# Patient Record
Sex: Female | Born: 1985 | Race: Black or African American | Hispanic: No | Marital: Single | Smoking: Former smoker
Health system: Southern US, Community
[De-identification: ages and names within clinical notes are randomized; demographics above are authoritative.]

## PROBLEM LIST (undated history)

## (undated) DIAGNOSIS — E079 Disorder of thyroid, unspecified: Secondary | ICD-10-CM

## (undated) DIAGNOSIS — I10 Essential (primary) hypertension: Secondary | ICD-10-CM

## (undated) DIAGNOSIS — N200 Calculus of kidney: Secondary | ICD-10-CM

## (undated) DIAGNOSIS — N83201 Unspecified ovarian cyst, right side: Secondary | ICD-10-CM

## (undated) DIAGNOSIS — Z87442 Personal history of urinary calculi: Secondary | ICD-10-CM

---
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
## 2011-03-16 ENCOUNTER — Emergency Department (HOSPITAL_COMMUNITY)
Admission: EM | Admit: 2011-03-16 | Discharge: 2011-03-16 | Disposition: A | Discharge status: Home / Self Care | Payer: Self-pay | Attending: Emergency Medicine | Admitting: Emergency Medicine

## 2011-03-16 ENCOUNTER — Emergency Department (HOSPITAL_COMMUNITY): Payer: Self-pay

## 2011-03-16 DIAGNOSIS — R51 Headache: Secondary | ICD-10-CM | POA: Insufficient documentation

## 2011-03-16 DIAGNOSIS — R079 Chest pain, unspecified: Secondary | ICD-10-CM | POA: Insufficient documentation

## 2011-03-16 DIAGNOSIS — I10 Essential (primary) hypertension: Secondary | ICD-10-CM | POA: Insufficient documentation

## 2011-03-16 LAB — CK TOTAL AND CKMB (NOT AT ARMC)
CK, MB: 1.4 ng/mL (ref 0.3–4.0)
Total CK: 114 U/L (ref 7–177)

## 2011-03-16 LAB — BASIC METABOLIC PANEL
BUN: 5 mg/dL — ABNORMAL LOW (ref 6–23)
CO2: 22 mEq/L (ref 19–32)
Calcium: 12 mg/dL — ABNORMAL HIGH (ref 8.4–10.5)
Creatinine, Ser: 0.62 mg/dL (ref 0.50–1.10)
GFR calc non Af Amer: >60 mL/min (ref >60)
Glucose, Bld: 107 mg/dL — ABNORMAL HIGH (ref 70–99)
Sodium: 140 mEq/L (ref 135–145)

## 2012-05-24 ENCOUNTER — Emergency Department (HOSPITAL_COMMUNITY)
Admission: EM | Admit: 2012-05-24 | Discharge: 2012-05-24 | Disposition: A | Discharge status: Home / Self Care | Payer: Self-pay | Attending: Emergency Medicine | Admitting: Emergency Medicine

## 2012-05-24 ENCOUNTER — Encounter (HOSPITAL_COMMUNITY): Payer: Self-pay | Admitting: *Deleted

## 2012-05-24 DIAGNOSIS — K0889 Other specified disorders of teeth and supporting structures: Secondary | ICD-10-CM

## 2012-05-24 DIAGNOSIS — F17200 Nicotine dependence, unspecified, uncomplicated: Secondary | ICD-10-CM | POA: Insufficient documentation

## 2012-05-24 DIAGNOSIS — K089 Disorder of teeth and supporting structures, unspecified: Secondary | ICD-10-CM | POA: Insufficient documentation

## 2012-05-24 MED ORDER — PENICILLIN V POTASSIUM 250 MG PO TABS: 250.0000 mg | ORAL_TABLET | Freq: Four times a day (QID) | ORAL | Status: AC

## 2012-05-24 MED ORDER — OXYCODONE-ACETAMINOPHEN 5-325 MG PO TABS
1.0000 | ORAL_TABLET | Freq: Once | ORAL | Status: AC
  Administered 2012-05-24: 1 via ORAL
  Filled 2012-05-24: qty 1

## 2012-05-24 MED ORDER — TRAMADOL HCL 50 MG PO TABS: 50.0000 mg | ORAL_TABLET | Freq: Four times a day (QID) | ORAL | Status: AC | PRN

## 2012-05-24 NOTE — ED Notes (Signed)
Pt reports taking 800 mg motrin with moderate relief for the past month.  States she thinks she's immune to them now because they no longer work.

## 2012-05-24 NOTE — ED Notes (Signed)
Pt is here with right lower jaw pain and swelling from tooth

## 2012-05-25 NOTE — ED Provider Notes (Signed)
History     CSN: [id number]  Arrival date & time 05/24/12  3086   First MD Initiated Contact with Patient 05/24/12 [phone]      Chief Complaint  Patient presents with  . Oral Swelling    right    (Consider location/radiation/quality/duration/timing/severity/associated sxs/prior treatment) Patient is a 26 y.o. female presenting with tooth pain. The history is provided by the patient.  Dental PainThe primary symptoms include mouth pain. Primary symptoms do not include dental injury, headaches, fever, sore throat or cough. The symptoms began 2 days ago. The symptoms are worsening. The symptoms are new. The symptoms occur constantly.  Additional symptoms include: dental sensitivity to temperature, gum tenderness, jaw pain and facial swelling. Additional symptoms do not include: purulent gums, trismus, trouble swallowing, pain with swallowing, excessive salivation, taste disturbance, smell disturbance and drooling. Medical issues include: periodontal disease.    History reviewed. No pertinent past medical history.  History reviewed. No pertinent past surgical history.  No family history on file.  History  Substance Use Topics  . Smoking status: Current Everyday Smoker  . Smokeless tobacco: Not on file  . Alcohol Use: Yes     occ    OB History    Grav Para Term Preterm Abortions TAB SAB Ect Mult Living                  Review of Systems  Constitutional: Negative for fever and chills.  HENT: Positive for facial swelling and dental problem. Negative for congestion, sore throat, rhinorrhea, drooling, mouth sores, trouble swallowing and neck pain.   Respiratory: Negative for cough.   Gastrointestinal: Negative for nausea.  Neurological: Negative for headaches.    Allergies  Review of patient's allergies indicates no known allergies.  Home Medications   Current Outpatient Rx  Name Route Sig Dispense Refill  . IBUPROFEN 200 MG PO TABS Oral Take 800 mg by mouth every 6 (six)  hours as needed. For pain    . PENICILLIN V POTASSIUM 250 MG PO TABS Oral Take 1 tablet (250 mg total) by mouth 4 (four) times daily. 40 tablet 0  . TRAMADOL HCL 50 MG PO TABS Oral Take 1 tablet (50 mg total) by mouth every 6 (six) hours as needed for pain. 15 tablet 0    BP 149/105  Pulse 103  Temp 98.5 F (36.9 C)  Resp 18  SpO2 94%  Physical Exam  Nursing note and vitals reviewed. Constitutional: She appears well-developed and well-nourished. No distress.  HENT:  Head: Normocephalic and atraumatic.  Mouth/Throat: Oropharynx is clear and moist. No oropharyngeal exudate.         Subtle R swelling along jawline. TTP to same. TTP to molar as diagrammed. No obvious evidence of gum swelling or purulence. Nontender to floor of mouth; no trismus.  Neck: Normal range of motion. Neck supple.  Cardiovascular: Normal rate.   Pulmonary/Chest: Effort normal.  Musculoskeletal: Normal range of motion.  Lymphadenopathy:    She has no cervical adenopathy.  Neurological: She is alert.  Skin: Skin is warm and dry. She is not diaphoretic.  Psychiatric: She has a normal mood and affect.    ED Course  Procedures (including critical care time)  Labs Reviewed - No data to display No results found.   1. Pain, dental       MDM  Pt presents with dental pain. No obvious evidence dental abscess but ttp to R lower molar as diagrammed, subtle facial swelling. Afebrile. Rxes for pcn, ultram.  Referral to dentist on call given as well as dental resources. Return precautions discussed.       Grant Fontana, PA-C 05/25/12 1315

## 2012-05-25 NOTE — ED Provider Notes (Signed)
Medical screening examination/treatment/procedure(s) were performed by non-physician practitioner and as supervising physician I was immediately available for consultation/collaboration.  Fadi Menter R. Heraclio Seidman, MD 05/25/12 1601 

## 2013-01-14 ENCOUNTER — Emergency Department (HOSPITAL_COMMUNITY)
Admission: EM | Admit: 2013-01-14 | Discharge: 2013-01-14 | Disposition: A | Discharge status: Home / Self Care | Payer: Self-pay | Attending: Emergency Medicine | Admitting: Emergency Medicine

## 2013-01-14 ENCOUNTER — Encounter (HOSPITAL_COMMUNITY): Payer: Self-pay | Admitting: *Deleted

## 2013-01-14 DIAGNOSIS — L255 Unspecified contact dermatitis due to plants, except food: Secondary | ICD-10-CM | POA: Insufficient documentation

## 2013-01-14 DIAGNOSIS — L237 Allergic contact dermatitis due to plants, except food: Secondary | ICD-10-CM

## 2013-01-14 DIAGNOSIS — F17200 Nicotine dependence, unspecified, uncomplicated: Secondary | ICD-10-CM | POA: Insufficient documentation

## 2013-01-14 MED ORDER — PREDNISONE 20 MG PO TABS: ORAL_TABLET | ORAL | Status: DC

## 2013-01-14 MED ORDER — PREDNISONE 20 MG PO TABS
60.0000 mg | ORAL_TABLET | Freq: Once | ORAL | Status: AC
  Administered 2013-01-14: 60 mg via ORAL
  Filled 2013-01-14: qty 3

## 2013-01-14 MED ORDER — DIPHENHYDRAMINE HCL 50 MG/ML IJ SOLN
50.0000 mg | Freq: Once | INTRAMUSCULAR | Status: AC
  Administered 2013-01-14: 50 mg via INTRAMUSCULAR
  Filled 2013-01-14: qty 1

## 2013-01-14 NOTE — ED Provider Notes (Signed)
History     CSN: [id number]  Arrival date & time 01/14/13  2841   First MD Initiated Contact with Patient 01/14/13 [phone]      Chief Complaint  Patient presents with  . Rash    (Consider location/radiation/quality/duration/timing/severity/associated sxs/prior treatment) The history is provided by the patient.  Tonya Brennan is a 27 y.o. female here presenting with rash. She went to go fishing and will touching some plants 3 days ago. She subsequently developed rash in her arms now spreading to her entire body and her face. The rash is very itchy but she has not been taking anything for it. No fevers or chills and no new medicines or over-the-counter products.    History reviewed. No pertinent past medical history.  History reviewed. No pertinent past surgical history.  No family history on file.  History  Substance Use Topics  . Smoking status: Current Every Day Smoker  . Smokeless tobacco: Not on file  . Alcohol Use: Yes     Comment: occ    OB History   Grav Para Term Preterm Abortions TAB SAB Ect Mult Living                  Review of Systems  Skin: Positive for rash.  All other systems reviewed and are negative.    Allergies  Review of patient's allergies indicates no known allergies.  Home Medications   Current Outpatient Rx  Name  Route  Sig  Dispense  Refill  . ibuprofen (ADVIL,MOTRIN) 200 MG tablet   Oral   Take 800 mg by mouth every 6 (six) hours as needed. For pain           BP 138/98  Pulse 98  Temp(Src) 98.6 F (37 C) (Oral)  Ht 5\' 6"  (1.676 m)  Wt 170 lb (77.111 kg)  BMI 27.45 kg/m2  SpO2 100%  Physical Exam  Nursing note and vitals reviewed. Constitutional: She is oriented to person, place, and time. She appears well-developed and well-nourished.  Uncomfortable, itchy   HENT:  Head: Normocephalic.  Mouth/Throat: Oropharynx is clear and moist.  Eyes: Conjunctivae are normal. Pupils are equal, round, and reactive to light.  Neck:  Normal range of motion. Neck supple.  Cardiovascular: Normal rate, regular rhythm and normal heart sounds.   Pulmonary/Chest: Effort normal and breath sounds normal. No respiratory distress. She has no wheezes. She has no rales.  Abdominal: Soft. Bowel sounds are normal. She exhibits no distension. There is no tenderness. There is no rebound.  Musculoskeletal: Normal range of motion.  Neurological: She is alert and oriented to person, place, and time.  Skin: Skin is warm.  Diffuse hives mostly on bilateral arms but also involving cheek and abdomen. No super imposed infection.   Psychiatric: She has a normal mood and affect. Her behavior is normal. Judgment and thought content normal.    ED Course  Procedures (including critical care time)  Labs Reviewed - No data to display No results found.   No diagnosis found.    MDM  Tonya Brennan is a 27 y.o. female here with hives. The rash seemed like poison Ivy. No super imposed infection. Will give benadryl and steroids. I told her not to scratch as much as it will spread.           Richardean Canal, MD 01/14/13 [phone]

## 2013-01-14 NOTE — ED Notes (Signed)
C/o worsening & spreading rash since Tuesday night after going fishing

## 2013-12-24 ENCOUNTER — Emergency Department (HOSPITAL_COMMUNITY)
Admission: EM | Admit: 2013-12-24 | Discharge: 2013-12-24 | Disposition: A | Discharge status: Home / Self Care | Payer: Self-pay | Attending: Emergency Medicine | Admitting: Emergency Medicine

## 2013-12-24 ENCOUNTER — Encounter (HOSPITAL_COMMUNITY): Payer: Self-pay | Admitting: Emergency Medicine

## 2013-12-24 DIAGNOSIS — R1110 Vomiting, unspecified: Secondary | ICD-10-CM

## 2013-12-24 DIAGNOSIS — R000 Tachycardia, unspecified: Secondary | ICD-10-CM | POA: Insufficient documentation

## 2013-12-24 DIAGNOSIS — Z87891 Personal history of nicotine dependence: Secondary | ICD-10-CM | POA: Insufficient documentation

## 2013-12-24 DIAGNOSIS — R112 Nausea with vomiting, unspecified: Secondary | ICD-10-CM | POA: Insufficient documentation

## 2013-12-24 DIAGNOSIS — Z3202 Encounter for pregnancy test, result negative: Secondary | ICD-10-CM | POA: Insufficient documentation

## 2013-12-24 DIAGNOSIS — J029 Acute pharyngitis, unspecified: Secondary | ICD-10-CM | POA: Insufficient documentation

## 2013-12-24 DIAGNOSIS — N390 Urinary tract infection, site not specified: Secondary | ICD-10-CM | POA: Insufficient documentation

## 2013-12-24 LAB — URINALYSIS, ROUTINE W REFLEX MICROSCOPIC
BILIRUBIN URINE: NEGATIVE
Glucose, UA: NEGATIVE mg/dL
KETONES UR: NEGATIVE mg/dL
NITRITE: NEGATIVE
PH: 8 (ref 5.0–8.0)
Protein, ur: NEGATIVE mg/dL
Specific Gravity, Urine: 1.017 (ref 1.005–1.030)
Urobilinogen, UA: 0.2 mg/dL (ref 0.0–1.0)

## 2013-12-24 LAB — COMPREHENSIVE METABOLIC PANEL
ALBUMIN: 3.6 g/dL (ref 3.5–5.2)
ALK PHOS: 248 U/L — AB (ref 39–117)
ALT: 10 U/L (ref 0–35)
AST: 15 U/L (ref 0–37)
BILIRUBIN TOTAL: 0.4 mg/dL (ref 0.3–1.2)
BUN: 4 mg/dL — AB (ref 6–23)
CHLORIDE: 105 meq/L (ref 96–112)
CO2: 19 mEq/L (ref 19–32)
Calcium: 12.4 mg/dL — ABNORMAL HIGH (ref 8.4–10.5)
Creatinine, Ser: 0.67 mg/dL (ref 0.50–1.10)
GFR calc Af Amer: >90 mL/min (ref >90)
GFR calc non Af Amer: >90 mL/min (ref >90)
GLUCOSE: 121 mg/dL — AB (ref 70–99)
POTASSIUM: 4 meq/L (ref 3.7–5.3)
Sodium: 139 mEq/L (ref 137–147)
Total Protein: 7.3 g/dL (ref 6.0–8.3)

## 2013-12-24 LAB — URINE MICROSCOPIC-ADD ON

## 2013-12-24 LAB — CBC WITH DIFFERENTIAL/PLATELET
BASOS PCT: 0 % (ref 0–1)
Basophils Absolute: 0 10*3/uL (ref 0.0–0.1)
Eosinophils Absolute: 0 10*3/uL (ref 0.0–0.7)
Eosinophils Relative: 0 % (ref 0–5)
HCT: 43.9 % (ref 36.0–46.0)
HEMOGLOBIN: 14.8 g/dL (ref 12.0–15.0)
LYMPHS ABS: 2.8 10*3/uL (ref 0.7–4.0)
Lymphocytes Relative: 20 % (ref 12–46)
MCH: 30.7 pg (ref 26.0–34.0)
MCHC: 33.7 g/dL (ref 30.0–36.0)
MCV: 91.1 fL (ref 78.0–100.0)
MONOS PCT: 6 % (ref 3–12)
Monocytes Absolute: 0.8 10*3/uL (ref 0.1–1.0)
NEUTROS ABS: 10.5 10*3/uL — AB (ref 1.7–7.7)
NEUTROS PCT: 74 % (ref 43–77)
Platelets: 286 10*3/uL (ref 150–400)
RBC: 4.82 MIL/uL (ref 3.87–5.11)
RDW: 13.1 % (ref 11.5–15.5)
WBC: 14.2 10*3/uL — AB (ref 4.0–10.5)

## 2013-12-24 LAB — LIPASE, BLOOD: LIPASE: 14 U/L (ref 11–59)

## 2013-12-24 LAB — PREGNANCY, URINE: Preg Test, Ur: NEGATIVE

## 2013-12-24 LAB — RAPID STREP SCREEN (MED CTR MEBANE ONLY): STREPTOCOCCUS, GROUP A SCREEN (DIRECT): NEGATIVE

## 2013-12-24 MED ORDER — CEPHALEXIN 500 MG PO CAPS: 500.0000 mg | ORAL_CAPSULE | Freq: Four times a day (QID) | ORAL | Status: DC

## 2013-12-24 MED ORDER — ONDANSETRON HCL 4 MG/2ML IJ SOLN
4.0000 mg | Freq: Once | INTRAMUSCULAR | Status: AC
  Administered 2013-12-24: 4 mg via INTRAVENOUS
  Filled 2013-12-24: qty 2

## 2013-12-24 MED ORDER — ACETAMINOPHEN 500 MG PO TABS
1000.0000 mg | ORAL_TABLET | Freq: Once | ORAL | Status: AC
  Administered 2013-12-24: 1000 mg via ORAL
  Filled 2013-12-24: qty 2

## 2013-12-24 MED ORDER — SODIUM CHLORIDE 0.9 % IV BOLUS (SEPSIS)
1000.0000 mL | Freq: Once | INTRAVENOUS | Status: AC
  Administered 2013-12-24: 1000 mL via INTRAVENOUS

## 2013-12-24 NOTE — ED Provider Notes (Signed)
0730 - Care from Dr. Curly Rim. 69F here with 5 days of sore throat, myalgias, occasional body aches. Vitals stable. Labs show UTI, will treat with Keflex for 5 days. Patient feeling better, eating graham crackers, texting. Stable for discharge.  1. Pharyngitis   2. Vomiting   3. UTI (urinary tract infection)      Osvaldo Shipper, MD 12/24/13 [phone]

## 2013-12-24 NOTE — ED Provider Notes (Addendum)
CSN: [id number]     Arrival date & time 12/24/13  0617 History   First MD Initiated Contact with Patient 12/24/13 [phone]     Chief Complaint  Patient presents with  . Sore Throat  . Generalized Body Aches     (Consider location/radiation/quality/duration/timing/severity/associated sxs/prior Treatment) HPI Comments: 28 yo AA female with cc of 5 days of ST and 2 days of n/v.  Pt denies f/c.  She denies sick contact, recent travel, exposure to spoiled foods.    Symptoms began with ST then progressed to n/v over past 2 days.    LMP: 2 weeks ago   Patient is a 28 y.o. female presenting with pharyngitis and vomiting. The history is provided by the patient.  Sore Throat This is a new problem. The current episode started more than 2 days ago. The problem has not changed since onset.Pertinent negatives include no chest pain, no abdominal pain, no headaches and no shortness of breath. Nothing aggravates the symptoms. Nothing relieves the symptoms. She has tried nothing for the symptoms.  Emesis Severity:  Moderate Duration:  2 days Timing:  Intermittent Number of daily episodes:  5 Quality:  Stomach contents Able to tolerate:  Liquids Progression:  Unchanged Chronicity:  New Recent urination:  Normal Context: not post-tussive and not self-induced   Relieved by:  None tried Worsened by:  Nothing tried Ineffective treatments:  None tried Associated symptoms: myalgias, sore throat and URI   Associated symptoms: no abdominal pain, no arthralgias, no chills, no cough, no diarrhea, no fever and no headaches   Risk factors: no alcohol use, no diabetes, not pregnant now, no prior abdominal surgery, no sick contacts, no suspect food intake and no travel to endemic areas     History reviewed. No pertinent past medical history. History reviewed. No pertinent past surgical history. No family history on file. History  Substance Use Topics  . Smoking status: Former Research scientist (life sciences)  . Smokeless tobacco: Not  on file  . Alcohol Use: Yes     Comment: occ   OB History   Grav Para Term Preterm Abortions TAB SAB Ect Mult Living                 Review of Systems  Constitutional: Negative for fever, chills, diaphoresis, activity change, appetite change, fatigue and unexpected weight change.  HENT: Positive for sore throat. Negative for ear pain, facial swelling, mouth sores, nosebleeds, postnasal drip, rhinorrhea, sinus pressure, sneezing, tinnitus, trouble swallowing and voice change.   Eyes: Negative.   Respiratory: Negative.  Negative for apnea, cough, choking, chest tightness, shortness of breath, wheezing and stridor.   Cardiovascular: Negative.  Negative for chest pain, palpitations and leg swelling.  Gastrointestinal: Positive for nausea and vomiting. Negative for abdominal pain, diarrhea, constipation, blood in stool, abdominal distention, anal bleeding and rectal pain.  Endocrine: Negative.   Genitourinary: Negative.  Negative for dysuria, urgency, frequency, hematuria, flank pain, decreased urine volume, vaginal bleeding, vaginal discharge, genital sores, vaginal pain, menstrual problem and pelvic pain.  Musculoskeletal: Positive for myalgias. Negative for arthralgias, back pain, gait problem, joint swelling, neck pain and neck stiffness.  Skin: Negative.   Neurological: Negative for tremors, seizures, syncope, facial asymmetry, speech difficulty, weakness, light-headedness, numbness and headaches.      Allergies  Review of patient's allergies indicates no known allergies.  Home Medications   Current Outpatient Rx  Name  Route  Sig  Dispense  Refill  . predniSONE (DELTASONE) 20 MG tablet  Take 60 mg daily for 5 days then 40 mg daily for 5 days then 20 mg daily for 5 days then stop   30 tablet   0    BP 144/99  Pulse 109  Temp(Src) 98.9 F (37.2 C) (Oral)  Resp 16  Wt 160 lb (72.576 kg)  SpO2 100%  LMP 12/19/2013 Physical Exam  Nursing note and vitals  reviewed. Constitutional: She is oriented to person, place, and time. She appears well-developed and well-nourished.  HENT:  Head: Normocephalic and atraumatic.  Redness to posterior OP No exudate, no petechiae, no cobble stoning  Eyes: Conjunctivae are normal. Pupils are equal, round, and reactive to light. Right eye exhibits no discharge. Left eye exhibits no discharge.  Neck: Normal range of motion. Neck supple. No JVD present. No tracheal deviation present. No thyromegaly present.  FAROM, no meningismus, no mass, neg tracheal rock  Cardiovascular: Regular rhythm and normal pulses.  Tachycardia present.  Exam reveals no friction rub.   No murmur heard. Pulmonary/Chest: Effort normal and breath sounds normal. No stridor. She has no wheezes. She has no rales. She exhibits no tenderness.  Abdominal: Soft. Bowel sounds are normal. She exhibits no distension and no mass. There is no tenderness. There is no rebound and no guarding.  Genitourinary:  Deferred.  No vb, vd, lower abd pain, pelvic pain.  Pt declined.   Musculoskeletal: Normal range of motion. She exhibits no edema and no tenderness.  Lymphadenopathy:    She has no cervical adenopathy.  Neurological: She is alert and oriented to person, place, and time. She has normal reflexes. She displays normal reflexes. She exhibits normal muscle tone. Coordination normal.    ED Course  Procedures (including critical care time) Labs Review Labs Reviewed  RAPID STREP SCREEN  CBC WITH DIFFERENTIAL  COMPREHENSIVE METABOLIC PANEL  LIPASE, BLOOD  URINALYSIS, ROUTINE W REFLEX MICROSCOPIC  PREGNANCY, URINE   Imaging Review No results found.   EKG Interpretation None      MDM   Final diagnoses:  Pharyngitis  Vomiting   28 year old AA female with cc of ST x 5 days and n/v x 2 days.  Pt is otherwise healthy.  Pt is AF. She has mild tachycardia.  Her physical exam is benign.  She denied VB, VD, or lower pelvic pain therefore no pelvic  performed.     EDC: Rapid strep screen obtained and pending CBC - wbc 14.11/06/41/286 CMP and Lipase pending.   UA/UHCG pending  Plan for IVF hydration, IV Zofran, and observation.  Pt is aware of the plan.  Pt t/o to Dr. Mingo Amber pending reassessment and lab results at approx 0730.      Elmer Sow, MD 12/24/13 [phone]

## 2013-12-24 NOTE — Discharge Instructions (Signed)
Pharyngitis Pharyngitis is redness, pain, and swelling (inflammation) of your pharynx.  CAUSES  Pharyngitis is usually caused by infection. Most of the time, these infections are from viruses (viral) and are part of a cold. However, sometimes pharyngitis is caused by bacteria (bacterial). Pharyngitis can also be caused by allergies. Viral pharyngitis may be spread from person to person by coughing, sneezing, and personal items or utensils (cups, forks, spoons, toothbrushes). Bacterial pharyngitis may be spread from person to person by more intimate contact, such as kissing.  SIGNS AND SYMPTOMS  Symptoms of pharyngitis include:   Sore throat.   Tiredness (fatigue).   Low-grade fever.   Headache.  Joint pain and muscle aches.  Skin rashes.  Swollen lymph nodes.  Plaque-like film on throat or tonsils (often seen with bacterial pharyngitis). DIAGNOSIS  Your health care provider will ask you questions about your illness and your symptoms. Your medical history, along with a physical exam, is often all that is needed to diagnose pharyngitis. Sometimes, a rapid strep test is done. Other lab tests may also be done, depending on the suspected cause.  TREATMENT  Viral pharyngitis will usually get better in 3 4 days without the use of medicine. Bacterial pharyngitis is treated with medicines that kill germs (antibiotics).  HOME CARE INSTRUCTIONS   Drink enough water and fluids to keep your urine clear or pale yellow.   Only take over-the-counter or prescription medicines as directed by your health care provider:   If you are prescribed antibiotics, make sure you finish them even if you start to feel better.   Do not take aspirin.   Get lots of rest.   Gargle with 8 oz of salt water ( tsp of salt per 1 qt of water) as often as every 1 2 hours to soothe your throat.   Throat lozenges (if you are not at risk for choking) or sprays may be used to soothe your throat. SEEK MEDICAL  CARE IF:   You have large, tender lumps in your neck.  You have a rash.  You cough up green, yellow-brown, or bloody spit. SEEK IMMEDIATE MEDICAL CARE IF:   Your neck becomes stiff.  You drool or are unable to swallow liquids.  You vomit or are unable to keep medicines or liquids down.  You have severe pain that does not go away with the use of recommended medicines.  You have trouble breathing (not caused by a stuffy nose). MAKE SURE YOU:   Understand these instructions.  Will watch your condition.  Will get help right away if you are not doing well or get worse. Document Released: 09/09/2005 Document Revised: 06/30/2013 Document Reviewed: 05/17/2013 [facility] Patient Information 2014 [place].  Urinary Tract Infection A urinary tract infection (UTI) can occur any place along the urinary tract. The tract includes the kidneys, ureters, bladder, and urethra. A type of germ called bacteria often causes a UTI. UTIs are often helped with antibiotic medicine.  HOME CARE   If given, take antibiotics as told by your doctor. Finish them even if you start to feel better.  Drink enough fluids to keep your pee (urine) clear or pale yellow.  Avoid tea, drinks with caffeine, and bubbly (carbonated) drinks.  Pee often. Avoid holding your pee in for a long time.  Pee before and after having sex (intercourse).  Wipe from front to back after you poop (bowel movement) if you are a woman. Use each tissue only once. GET HELP RIGHT AWAY IF:  You have back pain.  You have lower belly (abdominal) pain.  You have chills.  You feel sick to your stomach (nauseous).  You throw up (vomit).  Your burning or discomfort with peeing does not go away.  You have a fever.  Your symptoms are not better in 3 days. MAKE SURE YOU:   Understand these instructions.  Will watch your condition.  Will get help right away if you are not doing well or get worse. Document Released:  02/26/2008 Document Revised: 06/03/2012 Document Reviewed: 04/09/2012 [facility] Patient Information 2014 [place], [place].

## 2013-12-24 NOTE — ED Notes (Signed)
Pt reports sore throat x 5days, and emesis x3days. Pt is also reporting generalized body aches at this time.

## 2013-12-25 ENCOUNTER — Emergency Department (HOSPITAL_COMMUNITY)
Admission: EM | Admit: 2013-12-25 | Discharge: 2013-12-25 | Disposition: A | Discharge status: Home / Self Care | Payer: Self-pay | Attending: Emergency Medicine | Admitting: Emergency Medicine

## 2013-12-25 ENCOUNTER — Encounter (HOSPITAL_COMMUNITY): Payer: Self-pay | Admitting: Emergency Medicine

## 2013-12-25 ENCOUNTER — Emergency Department (HOSPITAL_COMMUNITY): Payer: Self-pay

## 2013-12-25 DIAGNOSIS — R079 Chest pain, unspecified: Secondary | ICD-10-CM | POA: Insufficient documentation

## 2013-12-25 DIAGNOSIS — Z87891 Personal history of nicotine dependence: Secondary | ICD-10-CM | POA: Insufficient documentation

## 2013-12-25 DIAGNOSIS — R112 Nausea with vomiting, unspecified: Secondary | ICD-10-CM | POA: Insufficient documentation

## 2013-12-25 DIAGNOSIS — E860 Dehydration: Secondary | ICD-10-CM | POA: Insufficient documentation

## 2013-12-25 DIAGNOSIS — R05 Cough: Secondary | ICD-10-CM | POA: Insufficient documentation

## 2013-12-25 DIAGNOSIS — R059 Cough, unspecified: Secondary | ICD-10-CM | POA: Insufficient documentation

## 2013-12-25 DIAGNOSIS — R197 Diarrhea, unspecified: Secondary | ICD-10-CM

## 2013-12-25 LAB — CBC WITH DIFFERENTIAL/PLATELET
Basophils Absolute: 0 10*3/uL (ref 0.0–0.1)
Basophils Relative: 0 % (ref 0–1)
EOS ABS: 0 10*3/uL (ref 0.0–0.7)
Eosinophils Relative: 0 % (ref 0–5)
HCT: 44.7 % (ref 36.0–46.0)
HEMOGLOBIN: 15.3 g/dL — AB (ref 12.0–15.0)
Lymphocytes Relative: 9 % — ABNORMAL LOW (ref 12–46)
Lymphs Abs: 0.9 10*3/uL (ref 0.7–4.0)
MCH: 31.5 pg (ref 26.0–34.0)
MCHC: 34.2 g/dL (ref 30.0–36.0)
MCV: 92 fL (ref 78.0–100.0)
MONOS PCT: 5 % (ref 3–12)
Monocytes Absolute: 0.5 10*3/uL (ref 0.1–1.0)
NEUTROS PCT: 86 % — AB (ref 43–77)
Neutro Abs: 8.1 10*3/uL — ABNORMAL HIGH (ref 1.7–7.7)
Platelets: 265 10*3/uL (ref 150–400)
RBC: 4.86 MIL/uL (ref 3.87–5.11)
RDW: 13 % (ref 11.5–15.5)
WBC: 9.5 10*3/uL (ref 4.0–10.5)

## 2013-12-25 LAB — COMPREHENSIVE METABOLIC PANEL
ALK PHOS: 217 U/L — AB (ref 39–117)
ALT: 9 U/L (ref 0–35)
AST: 13 U/L (ref 0–37)
Albumin: 3.5 g/dL (ref 3.5–5.2)
BUN: 4 mg/dL — AB (ref 6–23)
CO2: 22 mEq/L (ref 19–32)
CREATININE: 0.75 mg/dL (ref 0.50–1.10)
Calcium: 12 mg/dL — ABNORMAL HIGH (ref 8.4–10.5)
Chloride: 105 mEq/L (ref 96–112)
GFR calc non Af Amer: >90 mL/min (ref >90)
Glucose, Bld: 99 mg/dL (ref 70–99)
POTASSIUM: 3.8 meq/L (ref 3.7–5.3)
Sodium: 140 mEq/L (ref 137–147)
TOTAL PROTEIN: 7.3 g/dL (ref 6.0–8.3)
Total Bilirubin: 0.3 mg/dL (ref 0.3–1.2)

## 2013-12-25 LAB — LIPASE, BLOOD: LIPASE: 14 U/L (ref 11–59)

## 2013-12-25 LAB — URINALYSIS, ROUTINE W REFLEX MICROSCOPIC
BILIRUBIN URINE: NEGATIVE
Glucose, UA: NEGATIVE mg/dL
Hgb urine dipstick: NEGATIVE
Ketones, ur: 15 mg/dL — AB
LEUKOCYTES UA: NEGATIVE
NITRITE: NEGATIVE
PH: 7.5 (ref 5.0–8.0)
Protein, ur: NEGATIVE mg/dL
SPECIFIC GRAVITY, URINE: 1.018 (ref 1.005–1.030)
UROBILINOGEN UA: 1 mg/dL (ref 0.0–1.0)

## 2013-12-25 LAB — TROPONIN I: Troponin I: <0.3 ng/mL (ref <0.30)

## 2013-12-25 LAB — D-DIMER, QUANTITATIVE (NOT AT ARMC): D DIMER QUANT: 1.01 ug{FEU}/mL — AB (ref 0.00–0.48)

## 2013-12-25 MED ORDER — ONDANSETRON HCL 4 MG PO TABS: 4.0000 mg | ORAL_TABLET | Freq: Four times a day (QID) | ORAL | Status: DC

## 2013-12-25 MED ORDER — MORPHINE SULFATE 4 MG/ML IJ SOLN
4.0000 mg | Freq: Once | INTRAMUSCULAR | Status: AC
  Administered 2013-12-25: 4 mg via INTRAVENOUS
  Filled 2013-12-25: qty 1

## 2013-12-25 MED ORDER — ONDANSETRON HCL 4 MG/2ML IJ SOLN
4.0000 mg | Freq: Once | INTRAMUSCULAR | Status: AC
  Administered 2013-12-25: 4 mg via INTRAVENOUS
  Filled 2013-12-25: qty 2

## 2013-12-25 MED ORDER — SODIUM CHLORIDE 0.9 % IV BOLUS (SEPSIS)
1000.0000 mL | Freq: Once | INTRAVENOUS | Status: AC
  Administered 2013-12-25: 1000 mL via INTRAVENOUS

## 2013-12-25 MED ORDER — KETOROLAC TROMETHAMINE 30 MG/ML IJ SOLN
30.0000 mg | Freq: Once | INTRAMUSCULAR | Status: AC
  Administered 2013-12-25: 30 mg via INTRAVENOUS
  Filled 2013-12-25: qty 1

## 2013-12-25 MED ORDER — SODIUM CHLORIDE 0.9 % IV BOLUS (SEPSIS)
1000.0000 mL | Freq: Once | INTRAVENOUS | Status: AC
  Administered 2013-12-25 (×2): 1000 mL via INTRAVENOUS

## 2013-12-25 MED ORDER — IOHEXOL 350 MG/ML SOLN
100.0000 mL | Freq: Once | INTRAVENOUS | Status: AC | PRN
  Administered 2013-12-25: 100 mL via INTRAVENOUS

## 2013-12-25 NOTE — Discharge Instructions (Signed)
Dehydration, Adult Dehydration is when you lose more fluids from the body than you take in. Vital organs like the kidneys, brain, and heart cannot function without a proper amount of fluids and salt. Any loss of fluids from the body can cause dehydration.  CAUSES   Vomiting.  Diarrhea.  Excessive sweating.  Excessive urine output.  Fever. SYMPTOMS  Mild dehydration  Thirst.  Dry lips.  Slightly dry mouth. Moderate dehydration  Very dry mouth.  Sunken eyes.  Skin does not bounce back quickly when lightly pinched and released.  Dark urine and decreased urine production.  Decreased tear production.  Headache. Severe dehydration  Very dry mouth.  Extreme thirst.  Rapid, weak pulse (more than 100 beats per minute at rest).  Cold hands and feet.  Not able to sweat in spite of heat and temperature.  Rapid breathing.  Blue lips.  Confusion and lethargy.  Difficulty being awakened.  Minimal urine production.  No tears. DIAGNOSIS  Your caregiver will diagnose dehydration based on your symptoms and your exam. Blood and urine tests will help confirm the diagnosis. The diagnostic evaluation should also identify the cause of dehydration. TREATMENT  Treatment of mild or moderate dehydration can often be done at home by increasing the amount of fluids that you drink. It is best to drink small amounts of fluid more often. Drinking too much at one time can make vomiting worse. Refer to the home care instructions below. Severe dehydration needs to be treated at the hospital where you will probably be given intravenous (IV) fluids that contain water and electrolytes. HOME CARE INSTRUCTIONS   Ask your caregiver about specific rehydration instructions.  Drink enough fluids to keep your urine clear or pale yellow.  Drink small amounts frequently if you have nausea and vomiting.  Eat as you normally do.  Avoid:  Foods or drinks high in sugar.  Carbonated  drinks.  Juice.  Extremely hot or cold fluids.  Drinks with caffeine.  Fatty, greasy foods.  Alcohol.  Tobacco.  Overeating.  Gelatin desserts.  Wash your hands well to avoid spreading bacteria and viruses.  Only take over-the-counter or prescription medicines for pain, discomfort, or fever as directed by your caregiver.  Ask your caregiver if you should continue all prescribed and over-the-counter medicines.  Keep all follow-up appointments with your caregiver. SEEK MEDICAL CARE IF:  You have abdominal pain and it increases or stays in one area (localizes).  You have a rash, stiff neck, or severe headache.  You are irritable, sleepy, or difficult to awaken.  You are weak, dizzy, or extremely thirsty. SEEK IMMEDIATE MEDICAL CARE IF:   You are unable to keep fluids down or you get worse despite treatment.  You have frequent episodes of vomiting or diarrhea.  You have blood or green matter (bile) in your vomit.  You have blood in your stool or your stool looks black and tarry.  You have not urinated in 6 to 8 hours, or you have only urinated a small amount of very dark urine.  You have a fever.  You faint. MAKE SURE YOU:   Understand these instructions.  Will watch your condition.  Will get help right away if you are not doing well or get worse. Document Released: 09/09/2005 Document Revised: 12/02/2011 Document Reviewed: 04/29/2011 [facility] Patient Information 2014 [place], [place]. Viral Gastroenteritis Viral gastroenteritis is also known as stomach flu. This condition affects the stomach and intestinal tract. It can cause sudden diarrhea and vomiting. The illness typically  lasts 3 to 8 days. Most people develop an immune response that eventually gets rid of the virus. While this natural response develops, the virus can make you quite ill. CAUSES  Many different viruses can cause gastroenteritis, such as rotavirus or noroviruses. You can catch one of these  viruses by consuming contaminated food or water. You may also catch a virus by sharing utensils or other personal items with an infected person or by touching a contaminated surface. SYMPTOMS  The most common symptoms are diarrhea and vomiting. These problems can cause a severe loss of body fluids (dehydration) and a body salt (electrolyte) imbalance. Other symptoms may include:  Fever.  Headache.  Fatigue.  Abdominal pain. DIAGNOSIS  Your caregiver can usually diagnose viral gastroenteritis based on your symptoms and a physical exam. A stool sample may also be taken to test for the presence of viruses or other infections. TREATMENT  This illness typically goes away on its own. Treatments are aimed at rehydration. The most serious cases of viral gastroenteritis involve vomiting so severely that you are not able to keep fluids down. In these cases, fluids must be given through an intravenous line (IV). HOME CARE INSTRUCTIONS   Drink enough fluids to keep your urine clear or pale yellow. Drink small amounts of fluids frequently and increase the amounts as tolerated.  Ask your caregiver for specific rehydration instructions.  Avoid:  Foods high in sugar.  Alcohol.  Carbonated drinks.  Tobacco.  Juice.  Caffeine drinks.  Extremely hot or cold fluids.  Fatty, greasy foods.  Too much intake of anything at one time.  Dairy products until 24 to 48 hours after diarrhea stops.  You may consume probiotics. Probiotics are active cultures of beneficial bacteria. They may lessen the amount and number of diarrheal stools in adults. Probiotics can be found in yogurt with active cultures and in supplements.  Wash your hands well to avoid spreading the virus.  Only take over-the-counter or prescription medicines for pain, discomfort, or fever as directed by your caregiver. Do not give aspirin to children. Antidiarrheal medicines are not recommended.  Ask your caregiver if you should  continue to take your regular prescribed and over-the-counter medicines.  Keep all follow-up appointments as directed by your caregiver. SEEK IMMEDIATE MEDICAL CARE IF:   You are unable to keep fluids down.  You do not urinate at least once every 6 to 8 hours.  You develop shortness of breath.  You notice blood in your stool or vomit. This may look like coffee grounds.  You have abdominal pain that increases or is concentrated in one small area (localized).  You have persistent vomiting or diarrhea.  You have a fever.  The patient is a child younger than 3 months, and he or she has a fever.  The patient is a child older than 3 months, and he or she has a fever and persistent symptoms.  The patient is a child older than 3 months, and he or she has a fever and symptoms suddenly get worse.  The patient is a baby, and he or she has no tears when crying. MAKE SURE YOU:   Understand these instructions.  Will watch your condition.  Will get help right away if you are not doing well or get worse. Document Released: 09/09/2005 Document Revised: 12/02/2011 Document Reviewed: 06/26/2011 [facility] Patient Information 2014 [place].

## 2013-12-25 NOTE — ED Notes (Signed)
Patient transported to CT 

## 2013-12-25 NOTE — ED Notes (Addendum)
Pt given a [place] sandwich and apple and orange juice

## 2013-12-25 NOTE — ED Notes (Signed)
Pt c/o center chest pain that radiates to right chest area onset yesterday. Pt also c/o cough with yellow/green colored sputum x 4 days. Pt seen here yesterday for same and reports prescribed meds not helping. Pt tearful in triage, talking in complete sentences.

## 2013-12-25 NOTE — ED Provider Notes (Signed)
TIME SEEN: 3:23 PM  CHIEF COMPLAINT: Vomiting, diarrhea, chest pain, cough, shortness of breath  HPI: Patient is a 28 year old female with no significant past medical history presents emergency department with 4 days of sore throat, cough with yellow sputum production, vomiting and diarrhea, subjective fevers. She is also complaining of central sharp chest pain with no aggravating or alleviating factors or radiation. She was seen in the emergency department yesterday and had a negative strep test. Her labs showed a leukocytosis of 14.2. She had a urinary tract infection and was discharged home on Keflex. Urine pregnancy test is negative She reports that she was discharged she was feeling much better after 2 L of IV fluids. She states she is unable to keep her oral medications down at home.  She denies any sick contacts, recent travel, antibiotic use (other than Keflex for her UTI diagnosed yesterday) or hospitalization.  Patient denies history of PE or DVT, lower extremity swelling or pain, recent prolonged immobilization such as long flight or hospitalization, surgery, trauma, fracture, exogenous hormone use or tobacco use. No history of cardiac disease.  ROS: See HPI Constitutional:  fever  Eyes: no drainage  ENT: no runny nose   Cardiovascular:   chest pain  Resp: SOB  GI:  vomiting GU: no dysuria Integumentary: no rash  Allergy: no hives  Musculoskeletal: no leg swelling  Neurological: no slurred speech ROS otherwise negative  PAST MEDICAL HISTORY/PAST SURGICAL HISTORY:  History reviewed. No pertinent past medical history.  MEDICATIONS:  Prior to Admission medications   Medication Sig Start Date End Date Taking? Authorizing Provider  cephALEXin (KEFLEX) 500 MG capsule Take 1 capsule (500 mg total) by mouth 4 (four) times daily. 12/24/13   Osvaldo Shipper, MD    ALLERGIES:  No Known Allergies  SOCIAL HISTORY:  History  Substance Use Topics  . Smoking status: Former Research scientist (life sciences)  .  Smokeless tobacco: Not on file  . Alcohol Use: Yes     Comment: occ    FAMILY HISTORY: No family history on file.  EXAM: BP 135/99  Pulse 135  Temp(Src) 98.5 F (36.9 C) (Oral)  Resp 14  SpO2 100%  LMP 12/19/2013 CONSTITUTIONAL: Alert and oriented and responds appropriately to questions. Well-appearing; well-nourished, nontoxic but tearful HEAD: Normocephalic EYES: Conjunctivae clear, PERRL ENT: normal nose; no rhinorrhea; dry mucous membranes; pharynx without lesions noted, no tonsillar hypertrophy or exudate, no trismus or drooling, no muffled voice, no uvular deviation NECK: Supple, no meningismus, no LAD  CARD: Regular and tachycardic; S1 and S2 appreciated; no murmurs, no clicks, no rubs, no gallops RESP: Normal chest excursion without splinting or tachypnea; breath sounds clear and equal bilaterally; no wheezes, no rhonchi, no rales,  ABD/GI: Normal bowel sounds; non-distended; soft, non-tender, no rebound, no guarding BACK:  The back appears normal and is non-tender to palpation, there is no CVA tenderness EXT: Normal ROM in all joints; non-tender to palpation; no edema; normal capillary refill; no cyanosis    SKIN: Normal color for age and race; warm NEURO: Moves all extremities equally PSYCH: The patient's mood and manner are appropriate. Grooming and personal hygiene are appropriate.  MEDICAL DECISION MAKING: Patient here with likely viral illness. She is having sore throat, cough, vomiting or diarrhea. She appears dry on exam and is tachycardic. She is however complaining of chest pain shortness of breath. She has no risk factors for pulmonary embolus but given her significant tachycardia with CP and SOB, will obtain d-dimer. We'll also repeat labs, urine. Will  give IV fluids and IV Zofran.  ED PROGRESS: Patient's leukocytosis has resolved she still has predominant neutrophils. Her d-dimer is elevated. Will obtain a CT of her chest.   7:40 PM  Pt's urine shows ketones  but no other sign of infection. We'll have her continue her Keflex. She has had no further vomiting or diarrhea in the emergency department. Her heart rate has improved with IV fluids. Her CT scan shows no infiltrate or pulmonary embolus. She hasn't been eating a [place] sandwich, applesauce and drinking juice. Have discussed with patient return precautions. We'll discharge with nausea medication. Patient verbalizes understanding and is comfortable plan.    EKG Interpretation  Date/Time:  Saturday December 25 2013 15:12:11 EDT Ventricular Rate:  133 PR Interval:  128 QRS Duration: 64 QT Interval:  388 QTC Calculation: 577 R Axis:   53 Text Interpretation:  Sinus tachycardia Cannot rule out Anterior infarct , age undetermined Abnormal ECG Confirmed by Qamar Aughenbaugh,  DO, Kedron Uno [phone]) on 12/25/2013 3:23:11 PM        Wallace, DO 12/25/13 1941

## 2013-12-25 NOTE — ED Notes (Signed)
Returned from CT.

## 2013-12-26 LAB — CULTURE, GROUP A STREP

## 2014-08-26 ENCOUNTER — Encounter (HOSPITAL_COMMUNITY): Payer: Self-pay | Admitting: *Deleted

## 2014-08-26 ENCOUNTER — Emergency Department (HOSPITAL_COMMUNITY)
Admission: EM | Admit: 2014-08-26 | Discharge: 2014-08-26 | Discharge status: Against Medical Advice | Payer: Self-pay | Attending: Emergency Medicine | Admitting: Emergency Medicine

## 2014-08-26 DIAGNOSIS — R05 Cough: Secondary | ICD-10-CM | POA: Insufficient documentation

## 2014-08-26 DIAGNOSIS — R059 Cough, unspecified: Secondary | ICD-10-CM

## 2014-08-26 NOTE — ED Notes (Signed)
No answer for xray

## 2014-08-26 NOTE — ED Notes (Signed)
No answer in waiting area to take to a treatment room.  Called pt's number and she states that she left and will be back in 30 min.

## 2014-08-26 NOTE — ED Notes (Signed)
texting while being triaged

## 2014-08-26 NOTE — ED Notes (Signed)
She also has a toothache

## 2014-08-26 NOTE — ED Notes (Signed)
The pt has had a cold and cough for 3 weeks  No temp.  Non-productive cough.  Chest congestion pain when she coughs.  lmp one week ago

## 2015-09-11 ENCOUNTER — Emergency Department (HOSPITAL_COMMUNITY)
Admission: EM | Admit: 2015-09-11 | Discharge: 2015-09-11 | Disposition: A | Discharge status: Home / Self Care | Payer: Self-pay | Attending: Emergency Medicine | Admitting: Emergency Medicine

## 2015-09-11 ENCOUNTER — Emergency Department (HOSPITAL_COMMUNITY): Payer: Self-pay

## 2015-09-11 ENCOUNTER — Encounter (HOSPITAL_COMMUNITY): Payer: Self-pay | Admitting: Family Medicine

## 2015-09-11 DIAGNOSIS — M545 Low back pain, unspecified: Secondary | ICD-10-CM

## 2015-09-11 DIAGNOSIS — Z87891 Personal history of nicotine dependence: Secondary | ICD-10-CM | POA: Insufficient documentation

## 2015-09-11 MED ORDER — NAPROXEN 500 MG PO TABS: 500.0000 mg | ORAL_TABLET | Freq: Two times a day (BID) | ORAL | Status: DC

## 2015-09-11 MED ORDER — HYDROCODONE-ACETAMINOPHEN 5-325 MG PO TABS
1.0000 | ORAL_TABLET | Freq: Once | ORAL | Status: AC
  Administered 2015-09-11: 1 via ORAL
  Filled 2015-09-11: qty 1

## 2015-09-11 MED ORDER — METHOCARBAMOL 500 MG PO TABS
1000.0000 mg | ORAL_TABLET | Freq: Once | ORAL | Status: AC
  Administered 2015-09-11: 1000 mg via ORAL
  Filled 2015-09-11: qty 2

## 2015-09-11 MED ORDER — METHOCARBAMOL 500 MG PO TABS: 1000.0000 mg | ORAL_TABLET | Freq: Two times a day (BID) | ORAL | Status: DC | PRN

## 2015-09-11 NOTE — ED Notes (Signed)
Pt here for lower back pain after moving furniture last week. sts sharp intermittent at times. sts taking tylenol without relief.,

## 2015-09-11 NOTE — ED Notes (Signed)
Pt requested 1 week off for back pain.  Reported to PA that Pt is requesting 1 week off .

## 2015-09-11 NOTE — Discharge Instructions (Signed)
Follow up with your primary physician in 3-5 days.  Robaxin (muscle relaxer) and naproxen (anti-inflammatory) were your prescriptions.   Back Pain:  Your back pain should be treated with medicines such as ibuprofen or aleve and this back pain should get better over the next 2 weeks.  However if you develop severe or worsening pain, low back pain with fever, numbness, weakness or inability to walk or urinate, you should return to the ER immediately.  Please follow up with your doctor this week for a recheck if still having symptoms.  Low back pain is discomfort in the lower back that may be due to injuries to muscles and ligaments around the spine. Occasionally, it may be caused by a a problem to a part of the spine called a disc. The pain may last several days or a week;  However, most patients get completely well in 4 weeks.  COLD THERAPY DIRECTIONS:  Ice or gel packs can be used to reduce both pain and swelling. Ice is the most helpful within the first 24 to 48 hours after an injury or flareup from overusing a muscle or joint.  Ice is effective, has very few side effects, and is safe for most people to use.   If you expose your skin to cold temperatures for too long or without the proper protection, you can damage your skin or nerves. Watch for signs of skin damage due to cold.   HOME CARE INSTRUCTIONS  Follow these tips to use ice and cold packs safely.  Place a dry or damp towel between the ice and skin. A damp towel will cool the skin more quickly, so you may need to shorten the time that the ice is used.  For a more rapid response, add gentle compression to the ice.  Ice for no more than 10 to 20 minutes at a time. The bonier the area you are icing, the less time it will take to get the benefits of ice.  Check your skin after 5 minutes to make sure there are no signs of a poor response to cold or skin damage.  Rest 20 minutes or more in between uses.  Once your skin is numb, you can end  your treatment. You can test numbness by very lightly touching your skin. The touch should be so light that you do not see the skin dimple from the pressure of your fingertip. When using ice, most people will feel these normal sensations in this order: cold, burning, aching, and numbness.  Do not use ice on someone who cannot communicate their responses to pain, such as small children or people with dementia.   Medications are also useful to help with pain control.  A commonly prescribed medications includes acetaminophen.  This medication is generally safe, though you should not take more than 8 of the extra strength (500mg ) pills a day.  Non steroidal anti inflammatory medications including Ibuprofen and naproxen;  These medications help both pain and swelling and are very useful in treating back pain.  They should be taken with food, as they can cause stomach upset, and more seriously, stomach bleeding.    Muscle relaxants:  These medications can help with muscle tightness that is a cause of lower back pain.  Most of these medications can cause drowsiness, and it is not safe to drive or use dangerous machinery while taking them.  You will need to follow up with  Your primary healthcare provider in 1-2 weeks for reassessment.  Be aware that if you develop new symptoms, such as a fever, leg weakness, difficulty with or loss of control of your urine or bowels, abdominal pain, or more severe pain, you will need to seek medical attention and  / or return to the Emergency department.

## 2015-09-11 NOTE — ED Provider Notes (Signed)
CSN: [id number]     Arrival date & time 09/11/15  1111 History  By signing my name below, I, Stephania Fragmin, attest that this documentation has been prepared under the direction and in the presence of [facility], PA-C. Electronically Signed: Stephania Fragmin, ED Scribe. 09/11/2015. 5:00 PM.    Chief Complaint  Patient presents with  . Back Pain   The history is provided by the patient. No language interpreter was used.    HPI Comments: Tonya Brennan is a 29 y.o. female who presents to the Emergency Department complaining of worsening, intermittent, sharp lower back pain after moving furniture last week. She reports a history of pinched nerve and was previously advised not to engage in strenuous heavy lifting or pulling, but she states she was forced to do so when moving furniture and believes this exacerbated her back pain. Patient also complains of associated tingling down her right leg. She states she has been taking Motrin and Tylenol which initially reduced her pain from a level of 8/10 to 5/10, but she states they have decreased in efficacy over time. She reports coughing exacerbates her pain. She denies any known fever or bowel or bladder incontinence.   History reviewed. No pertinent past medical history. History reviewed. No pertinent past surgical history. History reviewed. No pertinent family history. Social History  Substance Use Topics  . Smoking status: Former Research scientist (life sciences)  . Smokeless tobacco: None  . Alcohol Use: Yes     Comment: occ   OB History    No data available     Review of Systems  Constitutional: Negative for fever.  Genitourinary:       Negative for bowel or bladder incontinence  Musculoskeletal: Positive for myalgias and back pain. Negative for neck pain and neck stiffness.   Allergies  Review of patient's allergies indicates no known allergies.  Home Medications   Prior to Admission medications   Medication Sig Start Date End Date Taking? Authorizing Provider   cephALEXin (KEFLEX) 500 MG capsule Take 1 capsule (500 mg total) by mouth 4 (four) times daily. 12/24/13   Evelina Bucy, MD  methocarbamol (ROBAXIN) 500 MG tablet Take 2 tablets (1,000 mg total) by mouth 2 (two) times daily as needed for muscle spasms. 09/11/15   Ozella Almond Ophie Burrowes, PA-C  naproxen (NAPROSYN) 500 MG tablet Take 1 tablet (500 mg total) by mouth 2 (two) times daily. 09/11/15   Ozella Almond Capone Schwinn, PA-C  ondansetron (ZOFRAN) 4 MG tablet Take 1 tablet (4 mg total) by mouth every 6 (six) hours. 12/25/13   Kristen N Marlette Curvin, DO   BP 131/87 mmHg  Pulse 83  Temp(Src) 98.1 F (36.7 C) (Oral)  Resp 18  SpO2 99%  LMP 08/28/2015 Physical Exam  Constitutional: She is oriented to person, place, and time. She appears well-developed and well-nourished.  NAD  HENT:  Head: Normocephalic and atraumatic.  Eyes: Conjunctivae and EOM are normal.  Neck: Neck supple.  Full ROM without pain No midline tenderness No tenderness of paraspinal musculature  Cardiovascular: Normal rate, regular rhythm, normal heart sounds and intact distal pulses.  Exam reveals no gallop and no friction rub.   No murmur heard. Pulmonary/Chest: Effort normal and breath sounds normal. No respiratory distress. She has no wheezes. She has no rales.  Abdominal: Soft. Bowel sounds are normal. She exhibits no distension. There is no tenderness.  Musculoskeletal: Normal range of motion.  Patient is able to ambulate without difficulty.  No noted deformities or signs of inflammation. Curvature of  cervical, thoracic, and lumbar spine within normal limits. No midline tenderness Tenderness to palpation of lumbar paraspinal musculature Able to straight leg with no pain.  5/5 muscle strength of bilateral LE's   Neurological: She is alert and oriented to person, place, and time. She has normal reflexes.  Bilateral lower extremities neurovascularly intact.  Skin: Skin is warm and dry. No rash noted. No erythema.  Psychiatric: She has  a normal mood and affect. Her behavior is normal.  Nursing note and vitals reviewed.   ED Course  Procedures (including critical care time)  DIAGNOSTIC STUDIES: Oxygen Saturation is 99% on RA, normal by my interpretation.    COORDINATION OF CARE: 5:00 PM - Discussed treatment plan with pt at bedside which includes dosage of muscle relaxant and pain medication administered here. Pt cautioned about sedating side effects of medication and states someone else is driving her home today. Pt verbalized understanding and agreed to plan.   Imaging Review  Dg Lumbar Spine 2-3 Views  09/11/2015  CLINICAL DATA:  Injury moving furniture last week with low back pain and right leg pain. EXAM: LUMBAR SPINE - 2-3 VIEW COMPARISON:  None. FINDINGS: Vertebral body alignment, heights and disc space heights are within normal. There is no compression fracture or subluxation. Minimal surface arthropathy over the L5-S1 level. Two oval radiodense structures over the right mid and lower abdomen possibly gastrointestinal contents. Mild sclerosis over the sacroiliac joints. IMPRESSION: No acute findings. Mild facet arthropathy over the L5-S1 level. Mild sclerosis along the sacroiliac joints. Electronically Signed   By: Marin Olp M.D.   On: 09/11/2015 14:10   I have personally reviewed and evaluated these images and lab results as part of my medical decision-making.  MDM   Final diagnoses:  Right-sided low back pain without sciatica   Michealle Camelo presents with back pain. No red flag sxs of low back pain. Normal neuro exam. No loss of bowel or bladder control. No concern for cauda equina. No fever, night sweats, weight loss, h/o cancer, IVDU. Lower extremities are neurovascularly intact and patient is ambulating without difficulty. Patient was counseled on back pain precautions and told to do activity as tolerated but do not lift, push, or pull heavy objects more than 10 pounds for the next week. Patient counseled to  use ice or heat on back for no longer than 15 minutes every hour.   Patient prescribed muscle relaxer and naproxen - counseled on proper use of these medications. Urged patient not to drink alcohol, drive, or perform any other activities that requires focus while taking muscle relaxer  Patient urged to follow-up with PCP if pain does not improve with treatment and rest or if pain becomes recurrent. Urged to return with worsening severe pain, loss of bowel or bladder control, trouble walking.   The patient verbalizes understanding and agrees with the plan.  I personally performed the services described in this documentation, which was scribed in my presence. The recorded information has been reviewed and is accurate.    [facility] Kyrin Gratz, PA-C 09/11/15 1704  Sherwood Gambler, MD 09/15/15 [phone]

## 2015-09-11 NOTE — ED Notes (Signed)
Declined W/C at D/C and was escorted to lobby by RN. 

## 2015-09-11 NOTE — ED Notes (Signed)
Patient states has been taking tylenol at home.  Patient states that the tylenol will take her pain from 8/10 to 5/10.   Patient complains of pain radiating from lower back down R leg.

## 2015-11-05 ENCOUNTER — Emergency Department (HOSPITAL_COMMUNITY): Payer: Self-pay

## 2015-11-05 ENCOUNTER — Emergency Department (HOSPITAL_COMMUNITY)
Admission: EM | Admit: 2015-11-05 | Discharge: 2015-11-05 | Disposition: A | Discharge status: Home / Self Care | Payer: Self-pay | Attending: Emergency Medicine | Admitting: Emergency Medicine

## 2015-11-05 DIAGNOSIS — Z791 Long term (current) use of non-steroidal anti-inflammatories (NSAID): Secondary | ICD-10-CM | POA: Insufficient documentation

## 2015-11-05 DIAGNOSIS — R197 Diarrhea, unspecified: Secondary | ICD-10-CM | POA: Insufficient documentation

## 2015-11-05 DIAGNOSIS — Z792 Long term (current) use of antibiotics: Secondary | ICD-10-CM | POA: Insufficient documentation

## 2015-11-05 DIAGNOSIS — Z3202 Encounter for pregnancy test, result negative: Secondary | ICD-10-CM | POA: Insufficient documentation

## 2015-11-05 DIAGNOSIS — R109 Unspecified abdominal pain: Secondary | ICD-10-CM | POA: Insufficient documentation

## 2015-11-05 DIAGNOSIS — Z79899 Other long term (current) drug therapy: Secondary | ICD-10-CM | POA: Insufficient documentation

## 2015-11-05 DIAGNOSIS — R69 Illness, unspecified: Secondary | ICD-10-CM

## 2015-11-05 DIAGNOSIS — R000 Tachycardia, unspecified: Secondary | ICD-10-CM | POA: Insufficient documentation

## 2015-11-05 DIAGNOSIS — J111 Influenza due to unidentified influenza virus with other respiratory manifestations: Secondary | ICD-10-CM | POA: Insufficient documentation

## 2015-11-05 DIAGNOSIS — Z87891 Personal history of nicotine dependence: Secondary | ICD-10-CM | POA: Insufficient documentation

## 2015-11-05 LAB — URINALYSIS, ROUTINE W REFLEX MICROSCOPIC
Bilirubin Urine: NEGATIVE
Glucose, UA: NEGATIVE mg/dL
Hgb urine dipstick: NEGATIVE
Ketones, ur: NEGATIVE mg/dL
Leukocytes, UA: NEGATIVE
Nitrite: NEGATIVE
Protein, ur: NEGATIVE mg/dL
Specific Gravity, Urine: 1.009 (ref 1.005–1.030)
pH: 7.5 (ref 5.0–8.0)

## 2015-11-05 LAB — CBC WITH DIFFERENTIAL/PLATELET
Basophils Absolute: 0 10*3/uL (ref 0.0–0.1)
Basophils Relative: 0 %
Eosinophils Absolute: 0 10*3/uL (ref 0.0–0.7)
Eosinophils Relative: 0 %
HCT: 45.5 % (ref 36.0–46.0)
Hemoglobin: 14.6 g/dL (ref 12.0–15.0)
Lymphocytes Relative: 21 %
Lymphs Abs: 1.6 10*3/uL (ref 0.7–4.0)
MCH: 29.6 pg (ref 26.0–34.0)
MCHC: 32.1 g/dL (ref 30.0–36.0)
MCV: 92.1 fL (ref 78.0–100.0)
Monocytes Absolute: 1.1 10*3/uL — ABNORMAL HIGH (ref 0.1–1.0)
Monocytes Relative: 15 %
Neutro Abs: 5 10*3/uL (ref 1.7–7.7)
Neutrophils Relative %: 64 %
Platelets: 210 10*3/uL (ref 150–400)
RBC: 4.94 MIL/uL (ref 3.87–5.11)
RDW: 13.1 % (ref 11.5–15.5)
WBC: 7.7 10*3/uL (ref 4.0–10.5)

## 2015-11-05 LAB — BASIC METABOLIC PANEL
Anion gap: 9 (ref 5–15)
BUN: <5 mg/dL — ABNORMAL LOW (ref 6–20)
CO2: 26 mmol/L (ref 22–32)
Calcium: 13.2 mg/dL (ref 8.9–10.3)
Chloride: 106 mmol/L (ref 101–111)
Creatinine, Ser: 0.82 mg/dL (ref 0.44–1.00)
GFR calc Af Amer: >60 mL/min (ref >60)
GFR calc non Af Amer: >60 mL/min (ref >60)
Glucose, Bld: 113 mg/dL — ABNORMAL HIGH (ref 65–99)
Potassium: 3.7 mmol/L (ref 3.5–5.1)
Sodium: 141 mmol/L (ref 135–145)

## 2015-11-05 LAB — PREGNANCY, URINE: Preg Test, Ur: NEGATIVE

## 2015-11-05 MED ORDER — ONDANSETRON HCL 4 MG/2ML IJ SOLN
4.0000 mg | Freq: Once | INTRAMUSCULAR | Status: AC
  Administered 2015-11-05: 4 mg via INTRAVENOUS
  Filled 2015-11-05: qty 2

## 2015-11-05 MED ORDER — GUAIFENESIN ER 1200 MG PO TB12: 1.0000 | ORAL_TABLET | Freq: Two times a day (BID) | ORAL | Status: DC

## 2015-11-05 MED ORDER — SODIUM CHLORIDE 0.9 % IV BOLUS (SEPSIS)
1000.0000 mL | Freq: Once | INTRAVENOUS | Status: AC
  Administered 2015-11-05: 1000 mL via INTRAVENOUS

## 2015-11-05 MED ORDER — PROMETHAZINE-DM 6.25-15 MG/5ML PO SYRP: 5.0000 mL | ORAL_SOLUTION | Freq: Four times a day (QID) | ORAL | Status: DC | PRN

## 2015-11-05 MED ORDER — IBUPROFEN 800 MG PO TABS: 800.0000 mg | ORAL_TABLET | Freq: Three times a day (TID) | ORAL | Status: DC | PRN

## 2015-11-05 NOTE — ED Notes (Signed)
CRITICAL VALUE ALERT  Critical value received:  Ca 13.2  Date of notification:  11/05/15  Time of notification:  0946  Critical value read back: yes  MD notified: Cathi Roan, [place]

## 2015-11-05 NOTE — ED Notes (Signed)
Pt c/o nausea/vomiting/diarrhea/cough productive for yellow mucus-- nasal congestion.

## 2015-11-05 NOTE — ED Provider Notes (Signed)
CSN: [id number]     Arrival date & time 11/05/15  0802 History   First MD Initiated Contact with Patient 11/05/15 [phone]     Chief Complaint  Patient presents with  . URI  . Fever  . flu like symptoms      (Consider location/radiation/quality/duration/timing/severity/associated sxs/prior Treatment) Patient is a 30 y.o. female presenting with URI and fever.  URI Presenting symptoms: fever   Fever  The patient presents to the Emergency Department complaining of fever and flu-like symptoms. Patient states that four days ago she began feeling bad, she then states that the next day she began vomiting, diarrhea, and had a fever of 103. She also had a runny nose. She states that she randomly vomits and every time she eats she vomits, however, she is able to keep down water. The next day the patient developed a cough where she began coughing up a yellow-like sputum. She continued to vomit intermittently, especially after eating, and also experienced post-tussive vomiting occasionally. The patient states that she feels her symptoms have continued to worsen. The patient uses marijuana daily, and she smoked yesterday. She has tried Thera-Flu, and Alka-Seltzer plus for symptoms and has found some relief or her cold-like symptoms. She also has been taking ibuprofen for fevers and states that it helps. She endorses dizziness and lightheadedness, headache, sinus pressure, cough, rhinorrhea, nasal congestion, myalgia, fatigue, pleuritic chest pain that worsens with coughing and deep breaths, SOB, chills, diaphoresis, sore throat, anorexia. She denies palpitations, hematochezia, blood in her vomit, dysuria, ear pain, tinnitus. No past medical history on file. No past surgical history on file. No family history on file. Social History  Substance Use Topics  . Smoking status: Former Research scientist (life sciences)  . Smokeless tobacco: Not on file  . Alcohol Use: Yes     Comment: occ   OB History    No data available     Review  of Systems  Constitutional: Positive for fever.   All pertinent positives and negatives reviewed in the history of present illness.   Allergies  Review of patient's allergies indicates no known allergies.  Home Medications   Prior to Admission medications   Medication Sig Start Date End Date Taking? Authorizing Provider  cephALEXin (KEFLEX) 500 MG capsule Take 1 capsule (500 mg total) by mouth 4 (four) times daily. 12/24/13   Evelina Bucy, MD  methocarbamol (ROBAXIN) 500 MG tablet Take 2 tablets (1,000 mg total) by mouth 2 (two) times daily as needed for muscle spasms. 09/11/15   Ozella Almond Ward, PA-C  naproxen (NAPROSYN) 500 MG tablet Take 1 tablet (500 mg total) by mouth 2 (two) times daily. 09/11/15   Ozella Almond Ward, PA-C  ondansetron (ZOFRAN) 4 MG tablet Take 1 tablet (4 mg total) by mouth every 6 (six) hours. 12/25/13   Kristen N Ward, DO   BP 152/99 mmHg  Pulse 109  Temp(Src) 99.8 F (37.7 C) (Oral)  Resp 18  SpO2 97% Physical Exam  Constitutional: She is oriented to person, place, and time. She appears well-developed and well-nourished. No distress.  HENT:  Head: Normocephalic and atraumatic.  Right Ear: External ear normal.  Left Ear: External ear normal.  Nose: Right sinus exhibits maxillary sinus tenderness and frontal sinus tenderness. Left sinus exhibits maxillary sinus tenderness and frontal sinus tenderness.  Posterior pharynx erythematous.  Eyes: Conjunctivae and EOM are normal. Pupils are equal, round, and reactive to light.  Neck: Normal range of motion. Neck supple.  Cardiovascular: Regular rhythm, normal heart sounds  and intact distal pulses.  Exam reveals no gallop and no friction rub.   No murmur heard. Tachycardic  Pulmonary/Chest: Effort normal and breath sounds normal. No respiratory distress. She has no wheezes. She exhibits tenderness.  Abdominal: Soft. Bowel sounds are normal. She exhibits no distension and no mass. There is tenderness (suprapubic).  There is no rebound and no guarding.  Musculoskeletal: Normal range of motion. She exhibits no edema.  Neurological: She is alert and oriented to person, place, and time.  Skin: Skin is warm. Diaphoretic: mild.  Psychiatric: She has a normal mood and affect. Her behavior is normal. Thought content normal.    ED Course  Procedures (including critical care time) Labs Review Labs Reviewed - No data to display  Imaging Review No results found. I have personally reviewed and evaluated these images and lab results as part of my medical decision-making.   EKG Interpretation None     0842 Patient vomiting several times after prolonged coughing spell, Zofran was given. 1007 Patient resting comfortably in bed, tolerating fluids, has not vomited.   Patient be discharged home.  She is advised of her calcium level.  The patient will need outpatient follow-up for parathyroid studies is advised to return here as needed.  Patient agrees the plan and all questions were answered.  Patient has an influenza-like illness    Dalia Heading, PA-C 11/05/15 [place], MD 11/14/15 2252

## 2015-11-05 NOTE — Discharge Instructions (Signed)
Return here as needed.  Follow-up with a primary care doctor about your calcium level.  This level is elevated for several years, but will need further evaluation

## 2015-11-05 NOTE — ED Notes (Signed)
Patient transported to X-ray 

## 2016-06-23 ENCOUNTER — Encounter (HOSPITAL_COMMUNITY): Payer: Self-pay | Admitting: Emergency Medicine

## 2016-06-23 ENCOUNTER — Emergency Department (HOSPITAL_COMMUNITY)
Admission: EM | Admit: 2016-06-23 | Discharge: 2016-06-23 | Disposition: A | Discharge status: Home / Self Care | Payer: Self-pay | Attending: Emergency Medicine | Admitting: Emergency Medicine

## 2016-06-23 DIAGNOSIS — K029 Dental caries, unspecified: Secondary | ICD-10-CM

## 2016-06-23 DIAGNOSIS — K0889 Other specified disorders of teeth and supporting structures: Secondary | ICD-10-CM

## 2016-06-23 DIAGNOSIS — Z87891 Personal history of nicotine dependence: Secondary | ICD-10-CM | POA: Insufficient documentation

## 2016-06-23 MED ORDER — PENICILLIN V POTASSIUM 250 MG PO TABS
500.0000 mg | ORAL_TABLET | Freq: Once | ORAL | Status: AC
  Administered 2016-06-23: 500 mg via ORAL
  Filled 2016-06-23: qty 2

## 2016-06-23 MED ORDER — HYDROCODONE-ACETAMINOPHEN 5-325 MG PO TABS
1.0000 | ORAL_TABLET | Freq: Once | ORAL | Status: AC
  Administered 2016-06-23: 1 via ORAL
  Filled 2016-06-23: qty 1

## 2016-06-23 MED ORDER — PENICILLIN V POTASSIUM 250 MG PO TABS: 500.0000 mg | ORAL_TABLET | Freq: Four times a day (QID) | ORAL | 0 refills | Status: AC

## 2016-06-23 NOTE — ED Provider Notes (Signed)
[facility] DEPT Provider Note   CSN: [id number] Arrival date & time: 06/23/16  0606     History   Chief Complaint Chief Complaint  Patient presents with  . Dental Pain    HPI Tonya Brennan is a 30 y.o. female with no significant past medical history who presents to the ED today complaining of right-sided dental pain. Patient states that her right lower molar broke off over a year ago and she has intermittent pain since then. However the pain has severely worsened over the last week and she has noticed some right-sided facial swelling. She now has pain in her severe as well as a headache. Patient started taking ibuprofen for her symptoms without relief. She has not followed up with a dentist. Denies any fevers, chills, swelling under her tongue or difficulty swallowing.  HPI  History reviewed. No pertinent past medical history.  There are no active problems to display for this patient.   History reviewed. No pertinent surgical history.  OB History    No data available       Home Medications    Prior to Admission medications   Medication Sig Start Date End Date Taking? Authorizing Provider  cephALEXin (KEFLEX) 500 MG capsule Take 1 capsule (500 mg total) by mouth 4 (four) times daily. Patient not taking: Reported on 11/05/2015 12/24/13   Evelina Bucy, MD  Chlorphen-Pseudoephed-APAP [facility] FLU/COLD PO) Take 0.5-1 tablets by mouth every 6 (six) hours as needed.    Historical Provider, MD  Guaifenesin 1200 MG TB12 Take 1 tablet (1,200 mg total) by mouth 2 (two) times daily. 11/05/15   Dalia Heading, PA-C  ibuprofen (ADVIL,MOTRIN) 800 MG tablet Take 1 tablet (800 mg total) by mouth every 8 (eight) hours as needed. 11/05/15   Dalia Heading, PA-C  methocarbamol (ROBAXIN) 500 MG tablet Take 2 tablets (1,000 mg total) by mouth 2 (two) times daily as needed for muscle spasms. Patient not taking: Reported on 11/05/2015 09/11/15   [facility] Ward, PA-C  Multiple Vitamin  (MULTIVITAMIN) tablet Take 1 tablet by mouth daily.    Historical Provider, MD  naproxen (NAPROSYN) 500 MG tablet Take 1 tablet (500 mg total) by mouth 2 (two) times daily. Patient not taking: Reported on 11/05/2015 09/11/15   [facility] Ward, PA-C  ondansetron (ZOFRAN) 4 MG tablet Take 1 tablet (4 mg total) by mouth every 6 (six) hours. Patient not taking: Reported on 11/05/2015 12/25/13   Kristen N Ward, DO  Phenyleph-Doxylamine-DM-APAP (ALKA-SELTZER PLS NIGHT CLD/FLU PO) Take 1 tablet by mouth every 4 (four) hours as needed (for cold symptoms).    Historical Provider, MD  promethazine-dextromethorphan (PROMETHAZINE-DM) 6.25-15 MG/5ML syrup Take 5 mLs by mouth 4 (four) times daily as needed for cough. 11/05/15   Dalia Heading, PA-C    Family History No family history on file.  Social History Social History  Substance Use Topics  . Smoking status: Former Research scientist (life sciences)  . Smokeless tobacco: Never Used  . Alcohol use Yes     Comment: occ     Allergies   Theraflu max-d cold & flu [pseudoephedrine-dm-gg-apap]   Review of Systems Review of Systems  All other systems reviewed and are negative.    Physical Exam Updated Vital Signs BP 146/99 (BP Location: Right Arm)   Pulse 73   Temp 98.1 F (36.7 C) (Oral)   Resp 18   Ht 5\' 6"  (1.676 m)   Wt 77.1 kg   LMP 06/23/2016   SpO2 100%   BMI 27.44 kg/m  Physical Exam  Constitutional: She is oriented to person, place, and time. She appears well-developed and well-nourished. No distress.  HENT:  Head: Normocephalic and atraumatic.  Severe dental caries. Gingival erythema and swelling around right lower molar. No obvious abscess. No swelling under tongue. No trismus.  Eyes: Conjunctivae are normal. Right eye exhibits no discharge. Left eye exhibits no discharge. No scleral icterus.  Cardiovascular: Normal rate.   Pulmonary/Chest: Effort normal.  Neurological: She is alert and oriented to person, place, and time. Coordination normal.   Skin: Skin is warm and dry. No rash noted. She is not diaphoretic. No erythema. No pallor.  Psychiatric: She has a normal mood and affect. Her behavior is normal.  Nursing note and vitals reviewed.    ED Treatments / Results  Labs (all labs ordered are listed, but only abnormal results are displayed) Labs Reviewed - No data to display  EKG  EKG Interpretation None       Radiology No results found.  Procedures Procedures (including critical care time)  Medications Ordered in ED Medications  HYDROcodone-acetaminophen (NORCO/VICODIN) 5-325 MG per tablet 1 tablet (not administered)  penicillin v potassium (VEETID) tablet 500 mg (not administered)     Initial Impression / Assessment and Plan / ED Course  I have reviewed the triage vital signs and the nursing notes.  Pertinent labs & imaging results that were available during my care of the patient were reviewed by me and considered in my medical decision making (see chart for details).  Clinical Course    Patient with toothache due to severe dental caries and likely infection. No gross abscess.  Exam unconcerning for Ludwig's angina or spread of infection.  Will treat with penicillin and pain medicine.  Urged patient to follow-up with dentist.     Final Clinical Impressions(s) / ED Diagnoses   Final diagnoses:  Dental caries  Pain, dental    New Prescriptions New Prescriptions   No medications on file     Carlos Levering, PA-C 06/23/16 1454    Quintella Reichert, MD 06/23/16 2017

## 2016-06-23 NOTE — ED Triage Notes (Signed)
Per pt, she had a broken tooth last year, since then she has had intermittent pain. This morning the pain was more severe and there was pain behind her ear.

## 2016-06-23 NOTE — Discharge Instructions (Signed)
Take antibiotics as prescribed. Follow up with dentist as soon as possible for re-evaluation. Return to the ED if you experience severe worsening of your symptoms, increased facial swelling, difficulty breathing or swallowing, fevers.

## 2016-08-18 ENCOUNTER — Encounter (HOSPITAL_COMMUNITY): Payer: Self-pay | Admitting: Emergency Medicine

## 2016-08-18 ENCOUNTER — Emergency Department (HOSPITAL_COMMUNITY): Payer: Self-pay

## 2016-08-18 ENCOUNTER — Emergency Department (HOSPITAL_COMMUNITY)
Admission: EM | Admit: 2016-08-18 | Discharge: 2016-08-19 | Disposition: A | Discharge status: Home / Self Care | Payer: Self-pay | Attending: Emergency Medicine | Admitting: Emergency Medicine

## 2016-08-18 DIAGNOSIS — R079 Chest pain, unspecified: Secondary | ICD-10-CM | POA: Insufficient documentation

## 2016-08-18 DIAGNOSIS — Z87891 Personal history of nicotine dependence: Secondary | ICD-10-CM | POA: Insufficient documentation

## 2016-08-18 DIAGNOSIS — G43009 Migraine without aura, not intractable, without status migrainosus: Secondary | ICD-10-CM | POA: Insufficient documentation

## 2016-08-18 LAB — CBC
HCT: 44.1 % (ref 36.0–46.0)
Hemoglobin: 14.8 g/dL (ref 12.0–15.0)
MCH: 30.3 pg (ref 26.0–34.0)
MCHC: 33.6 g/dL (ref 30.0–36.0)
MCV: 90.4 fL (ref 78.0–100.0)
PLATELETS: 267 10*3/uL (ref 150–400)
RBC: 4.88 MIL/uL (ref 3.87–5.11)
RDW: 13.2 % (ref 11.5–15.5)
WBC: 8.4 10*3/uL (ref 4.0–10.5)

## 2016-08-18 LAB — BASIC METABOLIC PANEL
Anion gap: 6 (ref 5–15)
CO2: 21 mmol/L — ABNORMAL LOW (ref 22–32)
CREATININE: 0.65 mg/dL (ref 0.44–1.00)
Calcium: 12.3 mg/dL — ABNORMAL HIGH (ref 8.9–10.3)
Chloride: 110 mmol/L (ref 101–111)
Glucose, Bld: 115 mg/dL — ABNORMAL HIGH (ref 65–99)
Potassium: 3.7 mmol/L (ref 3.5–5.1)
SODIUM: 137 mmol/L (ref 135–145)

## 2016-08-18 LAB — HEPATIC FUNCTION PANEL
ALT: 13 U/L — AB (ref 14–54)
AST: 21 U/L (ref 15–41)
Albumin: 3.6 g/dL (ref 3.5–5.0)
Alkaline Phosphatase: 230 U/L — ABNORMAL HIGH (ref 38–126)
TOTAL PROTEIN: 6.7 g/dL (ref 6.5–8.1)
Total Bilirubin: 0.3 mg/dL (ref 0.3–1.2)

## 2016-08-18 LAB — I-STAT TROPONIN, ED: TROPONIN I, POC: 0 ng/mL (ref 0.00–0.08)

## 2016-08-18 LAB — LIPASE, BLOOD: LIPASE: 20 U/L (ref 11–51)

## 2016-08-18 MED ORDER — KETOROLAC TROMETHAMINE 15 MG/ML IJ SOLN
15.0000 mg | Freq: Once | INTRAMUSCULAR | Status: AC
Start: 2016-08-18 — End: 2016-08-18
  Administered 2016-08-18: 15 mg via INTRAVENOUS
  Filled 2016-08-18: qty 1

## 2016-08-18 MED ORDER — SODIUM CHLORIDE 0.9 % IV BOLUS (SEPSIS)
1000.0000 mL | Freq: Once | INTRAVENOUS | Status: AC
  Administered 2016-08-18: 1000 mL via INTRAVENOUS

## 2016-08-18 MED ORDER — PROCHLORPERAZINE EDISYLATE 5 MG/ML IJ SOLN
10.0000 mg | Freq: Once | INTRAMUSCULAR | Status: AC
  Administered 2016-08-18: 10 mg via INTRAVENOUS
  Filled 2016-08-18: qty 2

## 2016-08-18 MED ORDER — DIPHENHYDRAMINE HCL 50 MG/ML IJ SOLN
25.0000 mg | Freq: Once | INTRAMUSCULAR | Status: AC
  Administered 2016-08-18: 25 mg via INTRAVENOUS
  Filled 2016-08-18: qty 1

## 2016-08-18 NOTE — ED Notes (Signed)
Patient's call bell was ringing, this rn went in to see what I could do to help her, she said "I am going to hit this button until a doctor walks in here, I am done with nurses I do not want to see anymore nurses".  Instructed to the patient that I was going to get the doctor and ask her to come in there, I asked the patient if there was anything else I could get for her and she said for me to get out.  Patient is tearful, attempted to help talk to her about what was wrong and she asked me to leave. She hit the call bell while I was in the room I told her that I was standing right here and could help her and she said that she did not want to see another nurse, charge rn notified

## 2016-08-18 NOTE — ED Notes (Signed)
This RN went into patient's room after walking by and overhearing her talking to [place], NS about her "being here for 4 hours and no one has serviced her.. She wants to speak with Dr. Rex Kras, and couldn't get anyone to [facility] her from the bathroom." This RN stated to the patient that no one has forgotten about her, however, before I could say more, pt interrupted, stating that she wasn't even talking to me. I stated to her that I am one of the nurses taking care of her, I have been in the room, along with Levada Dy, RN, and apologized for her wait time prior to coming back to her room. Pt then ignored this RN and stated that everyone has been rude to her. I stated that the MD is in with a very sick patient right now and was told to go.

## 2016-08-18 NOTE — ED Notes (Signed)
Pt was in the room very upset when I went in to approach her and check on her. She was in tears saying she had asked for me 30 minutes ago. She told me that we had forgotten her and that she wanted to see a Dr. She said she had been here for 3 hours and this was ridiculous. She said there was nobody in the waiting area and she waited forever for no reason. She was tired of waiting. She said she was sick and she was tired of waiting on the dr.

## 2016-08-18 NOTE — ED Notes (Signed)
Called main lab to add on HFP and lipase.

## 2016-08-18 NOTE — ED Notes (Signed)
Patient being transported to x-ray at this time - transport to bring pt to treatment room afterward.

## 2016-08-18 NOTE — ED Triage Notes (Signed)
Pt c/o left sided chest pain onset today with nausea. Pt reports headache x 1 week.

## 2016-08-18 NOTE — ED Provider Notes (Signed)
[facility] DEPT Provider Note   CSN: [id number] Arrival date & time: 08/18/16  [id number]  By signing my name below, I, Dora Sims, attest that this documentation has been prepared under the direction and in the presence of physician practitioner, Merryl Hacker, MD. Electronically Signed: Dora Sims, Scribe. 08/18/2016. 11:06 PM.  History   Chief Complaint Chief Complaint  Patient presents with  . Chest Pain    The history is provided by the patient. No language interpreter was used.     HPI Comments: Tonya Brennan is a 30 y.o. female who presents to the Emergency Department complaining of sudden onset, constant,  8/10, frontal headache for the last two days. She notes associated dizziness, blurry vision, nausea, vomiting, and photophobia; she describes her dizziness as a room-spinning sensation and states it is worse with sitting up, bending over, and looking down. She states she has been vomiting with any food or fluid intake since onset. Pt has tried Tylenol, BC Powder, and ibuprofen with no relief of her headache. She denies h/o headaches and states it is very unusual for her to get a headache. She reports some chest pain beginning today as well as some neck pain. She denies h/o any major medical problems but notes her blood pressure becomes elevated occasionally; she does not take any medications for her BP. She states she smokes marijuana but denies tobacco, alcohol, or other drug use. She denies possibility of pregnancy. She further denies fever, numbness, weakness, abdominal pain, sore throat, or any other associated symptoms.   History reviewed. No pertinent past medical history.  There are no active problems to display for this patient.   History reviewed. No pertinent surgical history.  OB History    No data available       Home Medications    Prior to Admission medications   Medication Sig Start Date End Date Taking? Authorizing Provider  cephALEXin (KEFLEX)  500 MG capsule Take 1 capsule (500 mg total) by mouth 4 (four) times daily. Patient not taking: Reported on 11/05/2015 12/24/13   Evelina Bucy, MD  Chlorphen-Pseudoephed-APAP [facility] FLU/COLD PO) Take 0.5-1 tablets by mouth every 6 (six) hours as needed.    Historical Provider, MD  Guaifenesin 1200 MG TB12 Take 1 tablet (1,200 mg total) by mouth 2 (two) times daily. 11/05/15   Dalia Heading, PA-C  ibuprofen (ADVIL,MOTRIN) 800 MG tablet Take 1 tablet (800 mg total) by mouth every 8 (eight) hours as needed. 11/05/15   Dalia Heading, PA-C  methocarbamol (ROBAXIN) 500 MG tablet Take 2 tablets (1,000 mg total) by mouth 2 (two) times daily as needed for muscle spasms. Patient not taking: Reported on 11/05/2015 09/11/15   [facility] Ward, PA-C  Multiple Vitamin (MULTIVITAMIN) tablet Take 1 tablet by mouth daily.    Historical Provider, MD  naproxen (NAPROSYN) 500 MG tablet Take 1 tablet (500 mg total) by mouth 2 (two) times daily. Patient not taking: Reported on 11/05/2015 09/11/15   [facility] Ward, PA-C  ondansetron (ZOFRAN) 4 MG tablet Take 1 tablet (4 mg total) by mouth every 6 (six) hours. Patient not taking: Reported on 11/05/2015 12/25/13   Kristen N Ward, DO  Phenyleph-Doxylamine-DM-APAP (ALKA-SELTZER PLS NIGHT CLD/FLU PO) Take 1 tablet by mouth every 4 (four) hours as needed (for cold symptoms).    Historical Provider, MD  promethazine-dextromethorphan (PROMETHAZINE-DM) 6.25-15 MG/5ML syrup Take 5 mLs by mouth 4 (four) times daily as needed for cough. 11/05/15   Dalia Heading, PA-C    Family History No  family history on file.  Social History Social History  Substance Use Topics  . Smoking status: Former Research scientist (life sciences)  . Smokeless tobacco: Never Used  . Alcohol use Yes     Comment: occ     Allergies   Theraflu max-d cold & flu [pseudoephedrine-dm-gg-apap]   Review of Systems Review of Systems  Constitutional: Negative for fever.  HENT: Negative for sore throat.   Eyes:  Positive for photophobia.  Cardiovascular: Positive for chest pain.  Gastrointestinal: Positive for nausea and vomiting. Negative for abdominal pain.  Musculoskeletal: Negative for neck pain and neck stiffness.  Neurological: Positive for headaches. Negative for weakness and numbness.  All other systems reviewed and are negative.   Physical Exam Updated Vital Signs BP 133/95 (BP Location: Right Arm)   Pulse 100   Temp 98.1 F (36.7 C) (Oral)   Resp 18   Ht 5\' 6"  (1.676 m)   Wt 160 lb (72.6 kg)   LMP 07/28/2016   SpO2 99%   BMI 25.82 kg/m   Physical Exam  Constitutional: She is oriented to person, place, and time. She appears well-developed and well-nourished.  Tearful, no acute distress  HENT:  Head: Normocephalic and atraumatic.  Eyes: EOM are normal. Pupils are equal, round, and reactive to light.  Neck: Normal range of motion. Neck supple.  No meningismus  Cardiovascular: Normal rate, regular rhythm and normal heart sounds.   Pulmonary/Chest: Effort normal and breath sounds normal. No respiratory distress. She has no wheezes.  Abdominal: Soft. Bowel sounds are normal. There is no tenderness. There is no guarding.  Neurological: She is alert and oriented to person, place, and time.  Cranial nerves II through XII intact, 5 out of 5 strength in all 4 extremities, no dysmetria to finger-nose-finger  Skin: Skin is warm and dry.  Psychiatric: She has a normal mood and affect.  Nursing note and vitals reviewed.    ED Treatments / Results  Labs (all labs ordered are listed, but only abnormal results are displayed) Labs Reviewed  BASIC METABOLIC PANEL - Abnormal; Notable for the following:       Result Value   CO2 21 (*)    Glucose, Bld 115 (*)    BUN <5 (*)    Calcium 12.3 (*)    All other components within normal limits  HEPATIC FUNCTION PANEL - Abnormal; Notable for the following:    ALT 13 (*)    Alkaline Phosphatase 230 (*)    Bilirubin, Direct <0.1 (*)    All  other components within normal limits  CBC  LIPASE, BLOOD  I-STAT TROPOININ, ED  POC URINE PREG, ED  I-STAT BETA HCG BLOOD, ED ([facility], [facility], [facility] ONLY)  POC URINE PREG, ED    EKG  EKG Interpretation  Date/Time:  Sunday August 18 2016 19:14:46 EST Ventricular Rate:  102 PR Interval:  172 QRS Duration: 70 QT Interval:  316 QTC Calculation: 411 R Axis:   49 Text Interpretation:  Sinus tachycardia Possible Left atrial enlargement Cannot rule out Anterior infarct , age undetermined Abnormal ECG No significant change since last tracing Confirmed by LITTLE MD, RACHEL [id number]) on 08/18/2016 9:58:06 PM       Radiology Dg Chest 2 View  Result Date: 08/18/2016 CLINICAL DATA:  Chest pain cough and wheezing EXAM: CHEST  2 VIEW COMPARISON:  11/05/2015 FINDINGS: The heart size and mediastinal contours are within normal limits. Both lungs are clear. The visualized skeletal structures are unremarkable. IMPRESSION: No active cardiopulmonary disease. Electronically Signed  By: Donavan Foil M.D.   On: 08/18/2016 21:07    Procedures Procedures (including critical care time)  DIAGNOSTIC STUDIES: Oxygen Saturation is 98% on RA, normal by my interpretation.    COORDINATION OF CARE: 11:18 PM Discussed treatment plan with pt at bedside and pt agreed to plan.  Medications Ordered in ED Medications  sodium chloride 0.9 % bolus 1,000 mL (0 mLs Intravenous Stopped 08/19/16 0105)  prochlorperazine (COMPAZINE) injection 10 mg (10 mg Intravenous Given 08/18/16 2345)  diphenhydrAMINE (BENADRYL) injection 25 mg (25 mg Intravenous Given 08/18/16 2345)  ketorolac (TORADOL) 15 MG/ML injection 15 mg (15 mg Intravenous Given 08/18/16 2345)     Initial Impression / Assessment and Plan / ED Course  I have reviewed the triage vital signs and the nursing notes.  Pertinent labs & imaging results that were available during my care of the patient were reviewed by me and considered in my medical decision making (see  chart for details).  Clinical Course     Patient presents with complaints of headache and chest pain. Is tearful on exam. Initially was very upset regarding her weight. I apologized and explained to her the delay. She is neurologically intact. Vital signs are reassuring. Given photophobia and vomiting, question migraine. No history of the same. She denies worst headache of her life. No fever or neck stiffness.  Patient was given a migraine cocktail.  On recheck, she reports complete resolution of symptoms. She feels much better. She does not have a primary physician. I encouraged her to follow-up with [facility].  I personally performed the services described in this documentation, which was scribed in my presence. The recorded information has been reviewed and is accurate.   Final Clinical Impressions(s) / ED Diagnoses   Final diagnoses:  Migraine without aura and without status migrainosus, not intractable    New Prescriptions Discharge Medication List as of 08/19/2016  1:42 AM       Merryl Hacker, MD 08/19/16 [phone]

## 2016-08-18 NOTE — ED Notes (Signed)
Pt stated she has been having chest pain and headache for two days now. Nausea and vomiting. Pt. stated she has not been able to keep anything down. Pt. Stated she was very fatigue.

## 2016-08-19 LAB — I-STAT BETA HCG BLOOD, ED (MC, WL, AP ONLY): I-stat hCG, quantitative: <5 m[IU]/mL (ref <5)

## 2017-08-25 ENCOUNTER — Other Ambulatory Visit: Payer: Self-pay

## 2017-08-25 ENCOUNTER — Emergency Department (HOSPITAL_BASED_OUTPATIENT_CLINIC_OR_DEPARTMENT_OTHER): Payer: Self-pay

## 2017-08-25 ENCOUNTER — Emergency Department (HOSPITAL_BASED_OUTPATIENT_CLINIC_OR_DEPARTMENT_OTHER)
Admission: EM | Admit: 2017-08-25 | Discharge: 2017-08-25 | Disposition: A | Discharge status: Home / Self Care | Payer: Self-pay | Attending: Emergency Medicine | Admitting: Emergency Medicine

## 2017-08-25 ENCOUNTER — Encounter (HOSPITAL_BASED_OUTPATIENT_CLINIC_OR_DEPARTMENT_OTHER): Payer: Self-pay | Admitting: Emergency Medicine

## 2017-08-25 DIAGNOSIS — Y93K1 Activity, walking an animal: Secondary | ICD-10-CM | POA: Insufficient documentation

## 2017-08-25 DIAGNOSIS — Y998 Other external cause status: Secondary | ICD-10-CM | POA: Insufficient documentation

## 2017-08-25 DIAGNOSIS — Y929 Unspecified place or not applicable: Secondary | ICD-10-CM | POA: Insufficient documentation

## 2017-08-25 DIAGNOSIS — Z79899 Other long term (current) drug therapy: Secondary | ICD-10-CM | POA: Insufficient documentation

## 2017-08-25 DIAGNOSIS — K0889 Other specified disorders of teeth and supporting structures: Secondary | ICD-10-CM | POA: Insufficient documentation

## 2017-08-25 DIAGNOSIS — Z87891 Personal history of nicotine dependence: Secondary | ICD-10-CM | POA: Insufficient documentation

## 2017-08-25 DIAGNOSIS — X509XXA Other and unspecified overexertion or strenuous movements or postures, initial encounter: Secondary | ICD-10-CM | POA: Insufficient documentation

## 2017-08-25 DIAGNOSIS — S46912A Strain of unspecified muscle, fascia and tendon at shoulder and upper arm level, left arm, initial encounter: Secondary | ICD-10-CM | POA: Insufficient documentation

## 2017-08-25 MED ORDER — PENICILLIN V POTASSIUM 500 MG PO TABS: 500.0000 mg | ORAL_TABLET | Freq: Four times a day (QID) | ORAL | 0 refills | Status: AC

## 2017-08-25 MED ORDER — METHOCARBAMOL 750 MG PO TABS: 750.0000 mg | ORAL_TABLET | Freq: Four times a day (QID) | ORAL | 0 refills | Status: DC | PRN

## 2017-08-25 NOTE — ED Notes (Signed)
Patient ambulatory to x-ray.

## 2017-08-25 NOTE — Discharge Instructions (Signed)
Continue ibuprofen, tylenol and heat packs for pain Take muscle relaxants as prescribed.  You can follow-up with Dr. Barbaraann Barthel from sports medicine  Please also arrange follow-up with dentist. Take antibiotics for this as prescribed.  Return for worsening symptoms, including fever, difficulty breathing or swallowing, escalating pain or any other symptoms concerning to you.

## 2017-08-25 NOTE — ED Provider Notes (Signed)
[facility] EMERGENCY DEPARTMENT Provider Note   CSN: [id number] Arrival date & time: 08/25/17  0805     History   Chief Complaint Chief Complaint  Patient presents with  . Shoulder Injury  . Dental Pain    HPI Tonya Brennan is a 31 y.o. female.  HPI 31 year old female who presents with left shoulder pain and dental pain.  Left shoulder pain has been ongoing for 2-3 weeks, after her dog jerked her arm while she was walking him with a leash.  She did not fall, but since then has been having pain over the left shoulder that occasionally shoots down to the hand.  Pain improved when she is lying flat on her back or raising her left arm above her head.  Pain worse with range of motion of the shoulder.  Also states several weeks of right upper molar pain associated with a chipped tooth.  No swelling, difficulty breathing, difficulty swallowing.  Pain is worse with chewing and she has been eating softer foods.  Improved slightly with ibuprofen. History reviewed. No pertinent past medical history.  There are no active problems to display for this patient.   History reviewed. No pertinent surgical history.  OB History    No data available       Home Medications    Prior to Admission medications   Medication Sig Start Date End Date Taking? Authorizing Provider  cephALEXin (KEFLEX) 500 MG capsule Take 1 capsule (500 mg total) by mouth 4 (four) times daily. Patient not taking: Reported on 11/05/2015 12/24/13   Evelina Bucy, MD  Chlorphen-Pseudoephed-APAP [facility] FLU/COLD PO) Take 0.5-1 tablets by mouth every 6 (six) hours as needed.    [provider]  Guaifenesin 1200 MG TB12 Take 1 tablet (1,200 mg total) by mouth 2 (two) times daily. 11/05/15   Lawyer, Harrell Gave, PA-C  ibuprofen (ADVIL,MOTRIN) 800 MG tablet Take 1 tablet (800 mg total) by mouth every 8 (eight) hours as needed. 11/05/15   Lawyer, Harrell Gave, PA-C  methocarbamol (ROBAXIN) 500 MG tablet Take 2  tablets (1,000 mg total) by mouth 2 (two) times daily as needed for muscle spasms. Patient not taking: Reported on 11/05/2015 09/11/15   Ward, Ozella Almond, PA-C  Multiple Vitamin (MULTIVITAMIN) tablet Take 1 tablet by mouth daily.    [provider]  naproxen (NAPROSYN) 500 MG tablet Take 1 tablet (500 mg total) by mouth 2 (two) times daily. Patient not taking: Reported on 11/05/2015 09/11/15   Ward, York Cerise Pilcher, PA-C  ondansetron (ZOFRAN) 4 MG tablet Take 1 tablet (4 mg total) by mouth every 6 (six) hours. Patient not taking: Reported on 11/05/2015 12/25/13   Ward, Delice Bison, DO  Phenyleph-Doxylamine-DM-APAP (ALKA-SELTZER PLS NIGHT CLD/FLU PO) Take 1 tablet by mouth every 4 (four) hours as needed (for cold symptoms).    [provider]  promethazine-dextromethorphan (PROMETHAZINE-DM) 6.25-15 MG/5ML syrup Take 5 mLs by mouth 4 (four) times daily as needed for cough. 11/05/15   Dalia Heading, PA-C    Family History History reviewed. No pertinent family history.  Social History Social History   Tobacco Use  . Smoking status: Former Research scientist (life sciences)  . Smokeless tobacco: Never Used  Substance Use Topics  . Alcohol use: Yes    Comment: occ  . Drug use: No     Allergies   Theraflu max-d cold & flu [pseudoephedrine-[id number]]   Review of Systems Review of Systems  Constitutional: Negative for fever.  HENT: Positive for dental problem.   Respiratory: Negative for shortness  of breath.   Musculoskeletal:       Left shoulder pain   Skin: Negative for wound.  Allergic/Immunologic: Negative for immunocompromised state.  Neurological: Negative for weakness.  Hematological: Does not bruise/bleed easily.  All other systems reviewed and are negative.    Physical Exam Updated Vital Signs BP (!) 141/97 (BP Location: Right Arm)   Pulse 75   Temp 98 F (36.7 C) (Oral)   Resp 18   Ht 5\' 6"  (1.676 m)   LMP 08/18/2017 (Approximate)   SpO2 100%   BMI 25.82 kg/m    Physical Exam Physical Exam  Nursing note and vitals reviewed. Constitutional: Well developed, well nourished, non-toxic, and in no acute distress Head: Normocephalic and atraumatic.  Mouth/Throat: Oropharynx is clear and moist. Poor dentition.  Cavitary lesions in bilateral upper and lower molars. No obvious gingival swelling or erythema. TTP of the right maxillary molars Neck: Normal range of motion. Neck supple.  Cardiovascular: Normal rate and regular rhythm.  +2 radial pulses bilaterally   Pulmonary/Chest: Effort normal and breath sounds normal.  Abdominal: Soft. There is no tenderness. There is no rebound and no guarding.  Musculoskeletal: Normal range of motion of left shoulder, but pain with shoulder extension and flexion.  Normal abduction and adduction of shoulder..  Neurological: Alert, no facial droop, fluent speech, moves all extremities symmetrically, full strength in bilateral upper extremities, full sensation intact in bilateral upper extremities Skin: Skin is warm and dry.  Psychiatric: Cooperative   ED Treatments / Results  Labs (all labs ordered are listed, but only abnormal results are displayed) Labs Reviewed - No data to display  EKG  EKG Interpretation None       Radiology No results found.  Procedures Procedures (including critical care time)  Medications Ordered in ED Medications - No data to display   Initial Impression / Assessment and Plan / ED Course  I have reviewed the triage vital signs and the nursing notes.  Pertinent labs & imaging results that were available during my care of the patient were reviewed by me and considered in my medical decision making (see chart for details).     31 year old female who presents with dental pain and left shoulder pain.  She is well-appearing in no acute distress.  Vital signs are non-concerning.  Regarding her dental pain, she has very poor dentition with many cavitary lesions.  Multiple chipped  tooth as well, but no obvious dental abscess.  There is tenderness to percussion over her right upper posterior molars.  Cannot rule out periapical abscess and will do a trial of penicillin.  Low cost dental resources provided.  Left shoulder pain felt to be more related to strain.  X-rays visualized and shows no fracture.  Extremity is neurovascularly intact.  Sling given for comfort but discussed range of motion exercises while in sling.  Referral to sports medicine provided.  To continue supportive care measures at home. Strict return and follow-up instructions reviewed. She expressed understanding of all discharge instructions and felt comfortable with the plan of care.   Final Clinical Impressions(s) / ED Diagnoses   Final diagnoses:  None    ED Discharge Orders    None       Forde Dandy, MD 08/25/17 [id number]

## 2017-08-25 NOTE — ED Triage Notes (Signed)
Patient reports left arm and shoulder pain x 2-3 weeks after her dog jerked her arm while walking him.  States that she has pain when laying on this shoulder.  Reports tingling down into her fingertips.  Patient also reports dental pain after cracking right upper molar last week while eating.

## 2018-08-03 ENCOUNTER — Other Ambulatory Visit: Payer: Self-pay

## 2018-08-03 ENCOUNTER — Emergency Department (HOSPITAL_BASED_OUTPATIENT_CLINIC_OR_DEPARTMENT_OTHER)
Admission: EM | Admit: 2018-08-03 | Discharge: 2018-08-03 | Disposition: A | Discharge status: Home / Self Care | Payer: Self-pay | Attending: Emergency Medicine | Admitting: Emergency Medicine

## 2018-08-03 ENCOUNTER — Emergency Department (HOSPITAL_BASED_OUTPATIENT_CLINIC_OR_DEPARTMENT_OTHER): Payer: Self-pay

## 2018-08-03 ENCOUNTER — Encounter (HOSPITAL_BASED_OUTPATIENT_CLINIC_OR_DEPARTMENT_OTHER): Payer: Self-pay | Admitting: Emergency Medicine

## 2018-08-03 DIAGNOSIS — N201 Calculus of ureter: Secondary | ICD-10-CM | POA: Insufficient documentation

## 2018-08-03 DIAGNOSIS — Z87891 Personal history of nicotine dependence: Secondary | ICD-10-CM | POA: Insufficient documentation

## 2018-08-03 LAB — CBC WITH DIFFERENTIAL/PLATELET
ABS IMMATURE GRANULOCYTES: 0.05 10*3/uL (ref 0.00–0.07)
BASOS PCT: 0 %
Basophils Absolute: 0 10*3/uL (ref 0.0–0.1)
Eosinophils Absolute: 0.1 10*3/uL (ref 0.0–0.5)
Eosinophils Relative: 1 %
HCT: 45 % (ref 36.0–46.0)
Hemoglobin: 14.1 g/dL (ref 12.0–15.0)
IMMATURE GRANULOCYTES: 1 %
LYMPHS PCT: 38 %
Lymphs Abs: 3.5 10*3/uL (ref 0.7–4.0)
MCH: 29.7 pg (ref 26.0–34.0)
MCHC: 31.3 g/dL (ref 30.0–36.0)
MCV: 94.9 fL (ref 80.0–100.0)
MONOS PCT: 6 %
Monocytes Absolute: 0.6 10*3/uL (ref 0.1–1.0)
NEUTROS ABS: 5.1 10*3/uL (ref 1.7–7.7)
NEUTROS PCT: 54 %
PLATELETS: 288 10*3/uL (ref 150–400)
RBC: 4.74 MIL/uL (ref 3.87–5.11)
RDW: 13.4 % (ref 11.5–15.5)
WBC: 9.2 10*3/uL (ref 4.0–10.5)
nRBC: 0 % (ref 0.0–0.2)

## 2018-08-03 LAB — URINALYSIS, ROUTINE W REFLEX MICROSCOPIC
Bilirubin Urine: NEGATIVE
Glucose, UA: NEGATIVE mg/dL
KETONES UR: NEGATIVE mg/dL
LEUKOCYTES UA: NEGATIVE
NITRITE: NEGATIVE
PH: 6.5 (ref 5.0–8.0)
Protein, ur: NEGATIVE mg/dL
SPECIFIC GRAVITY, URINE: 1.02 (ref 1.005–1.030)

## 2018-08-03 LAB — COMPREHENSIVE METABOLIC PANEL
ALT: 13 U/L (ref 0–44)
AST: 17 U/L (ref 15–41)
Albumin: 3.7 g/dL (ref 3.5–5.0)
Alkaline Phosphatase: 229 U/L — ABNORMAL HIGH (ref 38–126)
Anion gap: 5 (ref 5–15)
BUN: 7 mg/dL (ref 6–20)
CO2: 24 mmol/L (ref 22–32)
CREATININE: 0.65 mg/dL (ref 0.44–1.00)
Calcium: 11.6 mg/dL — ABNORMAL HIGH (ref 8.9–10.3)
Chloride: 110 mmol/L (ref 98–111)
GFR calc Af Amer: >60 mL/min (ref >60)
GFR calc non Af Amer: >60 mL/min (ref >60)
Glucose, Bld: 80 mg/dL (ref 70–99)
Potassium: 3.8 mmol/L (ref 3.5–5.1)
SODIUM: 139 mmol/L (ref 135–145)
Total Bilirubin: 0.5 mg/dL (ref 0.3–1.2)
Total Protein: 6.9 g/dL (ref 6.5–8.1)

## 2018-08-03 LAB — WET PREP, GENITAL
TRICH WET PREP: NONE SEEN
YEAST WET PREP: NONE SEEN

## 2018-08-03 LAB — URINALYSIS, MICROSCOPIC (REFLEX)

## 2018-08-03 LAB — PREGNANCY, URINE: Preg Test, Ur: NEGATIVE

## 2018-08-03 MED ORDER — IOPAMIDOL (ISOVUE-300) INJECTION 61%
100.0000 mL | Freq: Once | INTRAVENOUS | Status: AC | PRN
  Administered 2018-08-03: 100 mL via INTRAVENOUS

## 2018-08-03 NOTE — ED Triage Notes (Signed)
Sent by chiropractor after concerning xrays and continuous abdominal pain. Denies urinary sx, but does endorse nausea.

## 2018-08-03 NOTE — Discharge Instructions (Addendum)
There is evidence of kidney stone on the imaging studies.  Go directly to the urologist office following discharge from the emergency department.  Your calcium was higher than normal today.  You will need further work-up with a primary care provider on this matter.

## 2018-08-03 NOTE — ED Provider Notes (Signed)
[place] EMERGENCY DEPARTMENT Provider Note   CSN: [id number] Arrival date & time: 08/03/18  8250     History   Chief Complaint Chief Complaint  Patient presents with  . Abdominal Pain  . Sent by chiropractor    HPI Tonya Brennan is a 32 y.o. female.  HPI   Tonya Brennan is a 32 y.o. female, patient with no known past medical problems, presenting to the ED with abdominal pain for about the last 2 weeks.  Pain is suprapubic and right lower quadrant, was intermittent, constant for the last 2 days, 7/10 currently, sharp in nature, nonradiating. She was sent to the ED today from her chiropractor.  They did x-rays and was noted "something that was not supposed to be there," and told her to "get it checked out." LMP 11/2.  Last bowel movement was this morning and was normal. Denies fever/chills, N/V/C/D, dysuria, hematuria, abnormal vaginal discharge or bleeding, hematochezia/melena, or any other complaints.   History reviewed. No pertinent past medical history.  There are no active problems to display for this patient.   History reviewed. No pertinent surgical history.   OB History   None      Home Medications    Prior to Admission medications   Medication Sig Start Date End Date Taking? Authorizing Provider  ibuprofen (ADVIL,MOTRIN) 800 MG tablet Take 1 tablet (800 mg total) by mouth every 8 (eight) hours as needed. 11/05/15  Yes Lawyer, Harrell Gave, PA-C  Multiple Vitamin (MULTIVITAMIN) tablet Take 1 tablet by mouth daily.   Yes [provider]  cephALEXin (KEFLEX) 500 MG capsule Take 1 capsule (500 mg total) by mouth 4 (four) times daily. Patient not taking: Reported on 11/05/2015 12/24/13   Evelina Bucy, MD  Chlorphen-Pseudoephed-APAP [facility] FLU/COLD PO) Take 0.5-1 tablets by mouth every 6 (six) hours as needed.    [provider]  Guaifenesin 1200 MG TB12 Take 1 tablet (1,200 mg total) by mouth 2 (two) times daily. 11/05/15   Lawyer,  Harrell Gave, PA-C  methocarbamol (ROBAXIN) 500 MG tablet Take 2 tablets (1,000 mg total) by mouth 2 (two) times daily as needed for muscle spasms. Patient not taking: Reported on 11/05/2015 09/11/15   Ward, Ozella Almond, PA-C  methocarbamol (ROBAXIN-750) 750 MG tablet Take 1 tablet (750 mg total) by mouth every 6 (six) hours as needed for muscle spasms. 08/25/17   Forde Dandy, MD  naproxen (NAPROSYN) 500 MG tablet Take 1 tablet (500 mg total) by mouth 2 (two) times daily. Patient not taking: Reported on 11/05/2015 09/11/15   Ward, York Cerise Pilcher, PA-C  ondansetron (ZOFRAN) 4 MG tablet Take 1 tablet (4 mg total) by mouth every 6 (six) hours. Patient not taking: Reported on 11/05/2015 12/25/13   Ward, Delice Bison, DO  Phenyleph-Doxylamine-DM-APAP (ALKA-SELTZER PLS NIGHT CLD/FLU PO) Take 1 tablet by mouth every 4 (four) hours as needed (for cold symptoms).    [provider]  promethazine-dextromethorphan (PROMETHAZINE-DM) 6.25-15 MG/5ML syrup Take 5 mLs by mouth 4 (four) times daily as needed for cough. 11/05/15   Dalia Heading, PA-C    Family History History reviewed. No pertinent family history.  Social History Social History   Tobacco Use  . Smoking status: Former Research scientist (life sciences)  . Smokeless tobacco: Never Used  Substance Use Topics  . Alcohol use: Yes    Comment: occ  . Drug use: No     Allergies   Theraflu max-d cold & flu [pseudoephedrine-dm-gg-apap]   Review of Systems Review of Systems  Constitutional: Negative  for chills and fever.  Respiratory: Negative for shortness of breath.   Cardiovascular: Negative for chest pain.  Gastrointestinal: Positive for abdominal pain. Negative for blood in stool, constipation, diarrhea, nausea and vomiting.  Genitourinary: Negative for dysuria, flank pain, frequency, hematuria, vaginal bleeding and vaginal discharge.  Neurological: Negative for weakness and numbness.  All other systems reviewed and are negative.    Physical  Exam Updated Vital Signs BP (!) 134/98 (BP Location: Right Arm)   Pulse 92   Temp 98.5 F (36.9 C) (Oral)   Resp 16   Ht 5\' 6"  (1.676 m)   Wt 77.1 kg   SpO2 100%   BMI 27.44 kg/m   Physical Exam  Constitutional: She appears well-developed and well-nourished. No distress.  HENT:  Head: Normocephalic and atraumatic.  Eyes: Conjunctivae are normal.  Neck: Neck supple.  Cardiovascular: Normal rate, regular rhythm, normal heart sounds and intact distal pulses.  Pulmonary/Chest: Effort normal and breath sounds normal. No respiratory distress.  Abdominal: Soft. There is tenderness in the right lower quadrant and suprapubic area. There is no guarding and no CVA tenderness.    Patient tenderness is indicated verbally only with no other reaction.  Tenderness extends to the right flank.  Genitourinary:  Genitourinary Comments: External genitalia normal Vagina with discharge Cervix  normal negative for cervical motion tenderness Adnexa palpated, no masses, positive for tenderness noted on the right Bladder palpated positive for tenderness Uterus palpated no masses, positive for tenderness  No inguinal lymphadenopathy. Otherwise normal female genitalia. RN, Ubaldo Glassing, served as Producer, television/film/video during exam.  Musculoskeletal: She exhibits no edema.  Lymphadenopathy:    She has no cervical adenopathy.  Neurological: She is alert.  Skin: Skin is warm and dry. She is not diaphoretic.  Psychiatric: She has a normal mood and affect. Her behavior is normal.  Nursing note and vitals reviewed.            ED Treatments / Results  Labs (all labs ordered are listed, but only abnormal results are displayed) Labs Reviewed  WET PREP, GENITAL - Abnormal; Notable for the following components:      Result Value   Clue Cells Wet Prep HPF POC PRESENT (*)    WBC, Wet Prep HPF POC MODERATE (*)    All other components within normal limits  COMPREHENSIVE METABOLIC PANEL - Abnormal; Notable for the  following components:   Calcium 11.6 (*)    Alkaline Phosphatase 229 (*)    All other components within normal limits  URINALYSIS, ROUTINE W REFLEX MICROSCOPIC - Abnormal; Notable for the following components:   Hgb urine dipstick TRACE (*)    All other components within normal limits  URINALYSIS, MICROSCOPIC (REFLEX) - Abnormal; Notable for the following components:   Bacteria, UA FEW (*)    All other components within normal limits  URINE CULTURE  CBC WITH DIFFERENTIAL/PLATELET  PREGNANCY, URINE  RPR  HIV ANTIBODY (ROUTINE TESTING W REFLEX)  GC/CHLAMYDIA PROBE AMP ([place]) NOT AT [facility]    EKG None  Radiology Ct Abdomen Pelvis W Contrast  Result Date: 08/03/2018 CLINICAL DATA:  Diffuse abdominal pain for 1 day. EXAM: CT ABDOMEN AND PELVIS WITH CONTRAST TECHNIQUE: Multidetector CT imaging of the abdomen and pelvis was performed using the standard protocol following bolus administration of intravenous contrast. CONTRAST:  117mL ISOVUE-300 IOPAMIDOL (ISOVUE-300) INJECTION 61% COMPARISON:  None FINDINGS: Lower chest: No acute abnormality. Hepatobiliary: No focal liver abnormality is seen. No gallstones, gallbladder wall thickening, or biliary dilatation. Pancreas: Unremarkable. No  pancreatic ductal dilatation or surrounding inflammatory changes. Spleen: Normal in size without focal abnormality. Adrenals/Urinary Tract: Normal appearance of the adrenal glands. Within the left mid kidney there is a calcification measuring 9 mm, image 52/4. Multiple lower pole right renal calculi are identified. The largest measures 6 mm, image 45/4. There is right-sided hydronephrosis and hydroureter. At the right UVJ there is a large stone which measures 1.7 cm, image 42/4. More distally there is a right mid ureteral calculus measuring 1.2 cm, image 39/4. These appear to have been present on plain film radiographs of the lumbar spine from 09/11/2015. Urinary bladder appears normal. Stomach/Bowel: Stomach  appears normal. The small bowel loops have a normal course and caliber without obstruction. The appendix is visualized and appears normal. Unremarkable appearance of the colon. Vascular/Lymphatic: Normal appearance of the abdominal aorta. No enlarged retroperitoneal or mesenteric adenopathy. No enlarged pelvic or inguinal lymph nodes. Reproductive: The uterus appears normal. Right ovarian dermoid is identified measuring 5.6 by 5.0 by 5.2 cm. Normal appearance of the left ovary. Other: Trace free fluid noted within the pelvis. Musculoskeletal: No acute or significant osseous findings. IMPRESSION: 1. Right-sided hydronephrosis and hydroureter noted. At the right UPJ there is a stone measuring 1.7 cm. More distally there is a right ureteral calculus measuring 1.2 cm. 2. Bilateral renal calculi. 3. 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   On: 08/03/2018 12:24    Procedures Pelvic exam Date/Time: 08/03/2018 11:04 AM Performed by: Lorayne Bender, PA-C Authorized by: Lorayne Bender, PA-C  Consent: Verbal consent obtained. Risks and benefits: risks, benefits and alternatives were discussed Consent given by: patient Patient identity confirmed: verbally with patient and provided demographic data Local anesthesia used: no  Anesthesia: Local anesthesia used: no  Sedation: Patient sedated: no  Patient tolerance: Patient tolerated the procedure well with no immediate complications    (including critical care time)  Medications Ordered in ED Medications  iopamidol (ISOVUE-300) 61 % injection 100 mL (100 mLs Intravenous Contrast Given 08/03/18 1158)     Initial Impression / Assessment and Plan / ED Course  I have reviewed the triage vital signs and the nursing notes.  Pertinent labs & imaging results that were available during my care of the patient were reviewed by me and considered in my medical decision making (see chart for details).  Clinical  Course as of Aug 03 1308  Mon Aug 03, 2018  1145 Noted to be 24 a year ago.  Alkaline Phosphatase(!): 229 [SJ]  1233 Improved from 12.3 a year ago.  Calcium(!): 11.6 [SJ]  1255 Spoke with Dr. Jeffie Pollock, urologist. States patient can come straight to the office following discharge from the ED.   [SJ]    Clinical Course User Index [SJ] Kylie Gros C, PA-C   Patient presents with abdominal pain. Patient is nontoxic appearing, afebrile, not tachycardic, not tachypneic, not hypotensive, and is in no apparent distress.  Declined pain management throughout ED course.  2 large ureteral stones noted on CT.  Patient will see the urologist in the office following discharge. Patient also noted to have hypercalcemia.  Although this is improved from previous values, I advised patient to follow-up with a primary care provider for further work-up and management.  Resources given.   Patient also noted to be hypertensive.  She does not seem to be symptomatic to it at this time.  She will follow-up with a primary care provider on this as well.  Final  Clinical Impressions(s) / ED Diagnoses   Final diagnoses:  Right ureteral stone  Hypercalcemia    ED Discharge Orders    None       Layla Maw 08/03/18 1309    Mesner, Corene Cornea, MD 08/03/18 1641

## 2018-08-04 ENCOUNTER — Other Ambulatory Visit: Payer: Self-pay | Admitting: Urology

## 2018-08-04 DIAGNOSIS — N200 Calculus of kidney: Secondary | ICD-10-CM

## 2018-08-04 DIAGNOSIS — E213 Hyperparathyroidism, unspecified: Secondary | ICD-10-CM

## 2018-08-04 LAB — RPR: RPR Ser Ql: NONREACTIVE

## 2018-08-04 LAB — URINE CULTURE

## 2018-08-04 LAB — HIV ANTIBODY (ROUTINE TESTING W REFLEX): HIV Screen 4th Generation wRfx: NONREACTIVE

## 2018-08-04 LAB — GC/CHLAMYDIA PROBE AMP (~~LOC~~) NOT AT ARMC
Chlamydia: NEGATIVE
NEISSERIA GONORRHEA: NEGATIVE

## 2018-08-05 ENCOUNTER — Other Ambulatory Visit: Payer: Self-pay

## 2018-08-05 ENCOUNTER — Encounter (HOSPITAL_COMMUNITY): Payer: Self-pay | Admitting: *Deleted

## 2018-08-07 ENCOUNTER — Other Ambulatory Visit: Payer: Self-pay | Admitting: Radiology

## 2018-08-07 ENCOUNTER — Encounter (HOSPITAL_COMMUNITY)
Admission: RE | Admit: 2018-08-07 | Discharge: 2018-08-07 | Disposition: A | Discharge status: Home / Self Care | Payer: Self-pay | Source: Ambulatory Visit | Attending: Urology | Admitting: Urology

## 2018-08-07 DIAGNOSIS — E213 Hyperparathyroidism, unspecified: Secondary | ICD-10-CM | POA: Insufficient documentation

## 2018-08-07 MED ORDER — TECHNETIUM TC 99M SESTAMIBI - CARDIOLITE
23.3000 | Freq: Once | INTRAVENOUS | Status: AC | PRN
  Administered 2018-08-07: 23.3 via INTRAVENOUS

## 2018-08-10 ENCOUNTER — Observation Stay (HOSPITAL_COMMUNITY)
Admission: RE | Admit: 2018-08-10 | Discharge: 2018-08-12 | Disposition: A | Discharge status: Home / Self Care | Payer: Self-pay | Source: Ambulatory Visit | Attending: Urology | Admitting: Urology

## 2018-08-10 ENCOUNTER — Other Ambulatory Visit: Payer: Self-pay

## 2018-08-10 ENCOUNTER — Encounter (HOSPITAL_COMMUNITY): Payer: Self-pay

## 2018-08-10 ENCOUNTER — Ambulatory Visit (HOSPITAL_COMMUNITY)
Admission: RE | Admit: 2018-08-10 | Discharge: 2018-08-10 | Disposition: A | Discharge status: Home / Self Care | Payer: Self-pay | Source: Ambulatory Visit | Attending: Urology | Admitting: Urology

## 2018-08-10 DIAGNOSIS — Z885 Allergy status to narcotic agent status: Secondary | ICD-10-CM | POA: Insufficient documentation

## 2018-08-10 DIAGNOSIS — R0602 Shortness of breath: Secondary | ICD-10-CM

## 2018-08-10 DIAGNOSIS — F17200 Nicotine dependence, unspecified, uncomplicated: Secondary | ICD-10-CM | POA: Insufficient documentation

## 2018-08-10 DIAGNOSIS — N200 Calculus of kidney: Secondary | ICD-10-CM

## 2018-08-10 DIAGNOSIS — N202 Calculus of kidney with calculus of ureter: Principal | ICD-10-CM | POA: Insufficient documentation

## 2018-08-10 DIAGNOSIS — N83291 Other ovarian cyst, right side: Secondary | ICD-10-CM | POA: Insufficient documentation

## 2018-08-10 HISTORY — PX: IR URETERAL STENT RIGHT NEW ACCESS W/O SEP NEPHROSTOMY CATH: IMG6076

## 2018-08-10 LAB — BASIC METABOLIC PANEL
Anion gap: 7 (ref 5–15)
BUN: 8 mg/dL (ref 6–20)
CHLORIDE: 110 mmol/L (ref 98–111)
CO2: 21 mmol/L — ABNORMAL LOW (ref 22–32)
CREATININE: 0.69 mg/dL (ref 0.44–1.00)
Calcium: 12.3 mg/dL — ABNORMAL HIGH (ref 8.9–10.3)
GFR calc Af Amer: >60 mL/min (ref >60)
GFR calc non Af Amer: >60 mL/min (ref >60)
GLUCOSE: 90 mg/dL (ref 70–99)
Potassium: 4.1 mmol/L (ref 3.5–5.1)
Sodium: 138 mmol/L (ref 135–145)

## 2018-08-10 LAB — CBC WITH DIFFERENTIAL/PLATELET
Abs Immature Granulocytes: 0.03 10*3/uL (ref 0.00–0.07)
Basophils Absolute: 0 10*3/uL (ref 0.0–0.1)
Basophils Relative: 0 %
EOS PCT: 1 %
Eosinophils Absolute: 0 10*3/uL (ref 0.0–0.5)
HEMATOCRIT: 47.5 % — AB (ref 36.0–46.0)
HEMOGLOBIN: 15 g/dL (ref 12.0–15.0)
Immature Granulocytes: 0 %
LYMPHS ABS: 3.5 10*3/uL (ref 0.7–4.0)
LYMPHS PCT: 39 %
MCH: 29.6 pg (ref 26.0–34.0)
MCHC: 31.6 g/dL (ref 30.0–36.0)
MCV: 93.7 fL (ref 80.0–100.0)
MONO ABS: 0.5 10*3/uL (ref 0.1–1.0)
MONOS PCT: 5 %
Neutro Abs: 4.8 10*3/uL (ref 1.7–7.7)
Neutrophils Relative %: 55 %
Platelets: 288 10*3/uL (ref 150–400)
RBC: 5.07 MIL/uL (ref 3.87–5.11)
RDW: 13.3 % (ref 11.5–15.5)
WBC: 8.8 10*3/uL (ref 4.0–10.5)
nRBC: 0 % (ref 0.0–0.2)

## 2018-08-10 LAB — PROTIME-INR
INR: 0.98
Prothrombin Time: 12.9 seconds (ref 11.4–15.2)

## 2018-08-10 MED ORDER — HYDROMORPHONE HCL 2 MG PO TABS
4.0000 mg | ORAL_TABLET | ORAL | Status: DC | PRN
  Administered 2018-08-11 – 2018-08-12 (×2): 4 mg via ORAL
  Filled 2018-08-10 (×2): qty 2

## 2018-08-10 MED ORDER — MIDAZOLAM HCL 2 MG/2ML IJ SOLN
INTRAMUSCULAR | Status: AC
  Filled 2018-08-10: qty 2

## 2018-08-10 MED ORDER — CHLORHEXIDINE GLUCONATE CLOTH 2 % EX PADS
6.0000 | MEDICATED_PAD | CUTANEOUS | Status: DC
  Administered 2018-08-11 (×2): 6 via TOPICAL

## 2018-08-10 MED ORDER — LIDOCAINE HCL 1 % IJ SOLN
INTRAMUSCULAR | Status: AC
  Filled 2018-08-10: qty 20

## 2018-08-10 MED ORDER — IOPAMIDOL (ISOVUE-300) INJECTION 61%
INTRAVENOUS | Status: AC
  Filled 2018-08-10: qty 50

## 2018-08-10 MED ORDER — FENTANYL CITRATE (PF) 100 MCG/2ML IJ SOLN
INTRAMUSCULAR | Status: AC
  Filled 2018-08-10: qty 2

## 2018-08-10 MED ORDER — HYDROMORPHONE HCL 1 MG/ML IJ SOLN
1.0000 mg | Freq: Once | INTRAMUSCULAR | Status: AC
  Administered 2018-08-10: 1 mg via INTRAVENOUS
  Filled 2018-08-10: qty 1

## 2018-08-10 MED ORDER — FLEET ENEMA 7-19 GM/118ML RE ENEM: 1.0000 | ENEMA | Freq: Once | RECTAL | Status: DC | PRN

## 2018-08-10 MED ORDER — CEFAZOLIN SODIUM-DEXTROSE 2-4 GM/100ML-% IV SOLN
INTRAVENOUS | Status: AC
  Administered 2018-08-10: 2 g via INTRAVENOUS
  Filled 2018-08-10: qty 100

## 2018-08-10 MED ORDER — LIDOCAINE HCL (PF) 1 % IJ SOLN
INTRAMUSCULAR | Status: AC | PRN
  Administered 2018-08-10: 10 mL

## 2018-08-10 MED ORDER — HYDROCODONE-ACETAMINOPHEN 5-325 MG PO TABS: 1.0000 | ORAL_TABLET | ORAL | Status: DC | PRN

## 2018-08-10 MED ORDER — HYDROMORPHONE HCL 2 MG PO TABS
2.0000 mg | ORAL_TABLET | ORAL | Status: DC | PRN
  Administered 2018-08-12: 2 mg via ORAL
  Filled 2018-08-10: qty 1

## 2018-08-10 MED ORDER — ACETAMINOPHEN 10 MG/ML IV SOLN
1000.0000 mg | Freq: Four times a day (QID) | INTRAVENOUS | Status: AC
  Administered 2018-08-10 – 2018-08-11 (×3): 1000 mg via INTRAVENOUS
  Filled 2018-08-10 (×5): qty 100

## 2018-08-10 MED ORDER — MIDAZOLAM HCL 2 MG/2ML IJ SOLN
INTRAMUSCULAR | Status: AC
  Filled 2018-08-10: qty 4

## 2018-08-10 MED ORDER — SODIUM CHLORIDE 0.9 % IV SOLN
INTRAVENOUS | Status: DC
  Administered 2018-08-10 (×2): via INTRAVENOUS

## 2018-08-10 MED ORDER — HYDROCODONE-ACETAMINOPHEN 5-325 MG PO TABS
1.0000 | ORAL_TABLET | Freq: Once | ORAL | Status: AC
  Administered 2018-08-10: 1 via ORAL
  Filled 2018-08-10: qty 1

## 2018-08-10 MED ORDER — ACETAMINOPHEN 325 MG PO TABS: 650.0000 mg | ORAL_TABLET | ORAL | Status: DC | PRN

## 2018-08-10 MED ORDER — HYDROMORPHONE HCL 1 MG/ML IJ SOLN
0.5000 mg | INTRAMUSCULAR | Status: DC | PRN
  Administered 2018-08-10 – 2018-08-11 (×4): 1 mg via INTRAVENOUS
  Filled 2018-08-10 (×4): qty 1

## 2018-08-10 MED ORDER — SENNOSIDES-DOCUSATE SODIUM 8.6-50 MG PO TABS: 1.0000 | ORAL_TABLET | Freq: Every evening | ORAL | Status: DC | PRN

## 2018-08-10 MED ORDER — ONDANSETRON HCL 4 MG/2ML IJ SOLN: 4.0000 mg | INTRAMUSCULAR | Status: DC | PRN

## 2018-08-10 MED ORDER — IOPAMIDOL (ISOVUE-300) INJECTION 61%: 50.0000 mL | Freq: Once | INTRAVENOUS | Status: DC | PRN

## 2018-08-10 MED ORDER — CEFAZOLIN SODIUM-DEXTROSE 2-4 GM/100ML-% IV SOLN
2.0000 g | Freq: Once | INTRAVENOUS | Status: DC
  Filled 2018-08-10: qty 100

## 2018-08-10 MED ORDER — FENTANYL CITRATE (PF) 100 MCG/2ML IJ SOLN
25.0000 ug | Freq: Once | INTRAMUSCULAR | Status: AC
  Administered 2018-08-10: 25 ug via INTRAVENOUS

## 2018-08-10 MED ORDER — PROMETHAZINE HCL 25 MG/ML IJ SOLN
12.5000 mg | Freq: Four times a day (QID) | INTRAMUSCULAR | Status: DC | PRN
  Administered 2018-08-10 – 2018-08-11 (×2): 12.5 mg via INTRAVENOUS
  Filled 2018-08-10 (×2): qty 1

## 2018-08-10 MED ORDER — HYDROMORPHONE HCL 1 MG/ML IJ SOLN: 0.5000 mg | INTRAMUSCULAR | Status: DC | PRN

## 2018-08-10 MED ORDER — FENTANYL CITRATE (PF) 100 MCG/2ML IJ SOLN
INTRAMUSCULAR | Status: AC | PRN
  Administered 2018-08-10 (×5): 50 ug via INTRAVENOUS

## 2018-08-10 MED ORDER — HYDROMORPHONE HCL 1 MG/ML IJ SOLN
0.5000 mg | Freq: Once | INTRAMUSCULAR | Status: AC
  Administered 2018-08-10: 0.5 mg via INTRAVENOUS
  Filled 2018-08-10: qty 0.5

## 2018-08-10 MED ORDER — POTASSIUM CHLORIDE IN NACL 20-0.45 MEQ/L-% IV SOLN
INTRAVENOUS | Status: DC
  Administered 2018-08-10 – 2018-08-12 (×4): via INTRAVENOUS
  Filled 2018-08-10 (×4): qty 1000

## 2018-08-10 MED ORDER — FENTANYL CITRATE (PF) 100 MCG/2ML IJ SOLN
INTRAMUSCULAR | Status: AC
  Filled 2018-08-10: qty 4

## 2018-08-10 MED ORDER — BISACODYL 10 MG RE SUPP: 10.0000 mg | Freq: Every day | RECTAL | Status: DC | PRN

## 2018-08-10 MED ORDER — CEFAZOLIN SODIUM-DEXTROSE 2-4 GM/100ML-% IV SOLN
2.0000 g | INTRAVENOUS | Status: AC
  Administered 2018-08-10: 2 g via INTRAVENOUS

## 2018-08-10 MED ORDER — MIDAZOLAM HCL 2 MG/2ML IJ SOLN
INTRAMUSCULAR | Status: AC | PRN
  Administered 2018-08-10 (×4): 1 mg via INTRAVENOUS
  Administered 2018-08-10: 2 mg via INTRAVENOUS
  Administered 2018-08-10: 1 mg via INTRAVENOUS

## 2018-08-10 NOTE — Progress Notes (Signed)
Report called to [employer]. Pt transferred per w/c to 1425. pts mother present.

## 2018-08-10 NOTE — Consult Note (Signed)
Chief Complaint: Patient was seen in consultation today for right percutaneous nephrostomy/nephroureteral catheter placement  Referring Physician(s): Wrenn,John  Supervising Physician: Sandi Mariscal  Patient Status: [facility] - Out-pt  History of Present Illness: [name] is a 32 year old female with history of symptomatic nephrolithiasis and recent CT revealing : 1. Right-sided hydronephrosis and hydroureter noted. At the right UPJ there is a stone measuring 1.7 cm. More distally there is a right ureteral calculus measuring 1.2 cm. 2. Bilateral renal calculi. 3. 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  History reviewed. No pertinent past medical history.  History reviewed. No pertinent surgical history.  Allergies: Percocet [oxycodone-acetaminophen] and Theraflu max-d cold & flu [pseudoephedrine-dm-gg-apap]  Medications: Prior to Admission medications   Medication Sig Start Date End Date Taking? Authorizing Provider  BC FAST PAIN RELIEF [phone] MG PACK Take 1 packet by mouth 2 (two) times daily as needed (FOR PAIN.).   Yes [provider]  HYDROcodone-acetaminophen (NORCO/VICODIN) 5-325 MG tablet Take 1 tablet by mouth every 6 (six) hours as needed for pain. 08/03/18  Yes [provider]     History reviewed. No pertinent family history.  Social History   Socioeconomic History  . Marital status: Single    Spouse name: Not on file  . Number of children: Not on file  . Years of education: Not on file  . Highest education level: Not on file  Occupational History  . Not on file  Social Needs  . Financial resource strain: Not on file  . Food insecurity:    Worry: Not on file    Inability: Not on file  . Transportation needs:    Medical: Not on file    Non-medical: Not on file  Tobacco Use  . Smoking status: Former Research scientist (life sciences)    . Smokeless tobacco: Never Used  Substance and Sexual Activity  . Alcohol use: Never    Frequency: Never  . Drug use: Yes    Types: Marijuana    Comment: daily use   . Sexual activity: Not on file  Lifestyle  . Physical activity:    Days per week: Not on file    Minutes per session: Not on file  . Stress: Not on file  Relationships  . Social connections:    Talks on phone: Not on file    Gets together: Not on file    Attends religious service: Not on file    Active member of club or organization: Not on file    Attends meetings of clubs or organizations: Not on file    Relationship status: Not on file  Other Topics Concern  . Not on file  Social History Narrative  . Not on file      Review of Systems she denies fever, chills, chest pain, dyspnea, vomiting or abnormal bleeding.  She does have occasional headaches, occasional cough, right flank/lateral abdominal discomfort and occasional nausea.  Vital Signs: BP (!) 140/108   Pulse 80   Temp 98.6 F (37 C) (Oral)   Resp 16   LMP 07/27/2018   SpO2 100%   Physical Exam awake, alert.  Chest clear to auscultation bilaterally.  Heart with regular rate and rhythm.  Abdomen soft, positive bowel sounds, mildly tender right lateral abdomen/right CVA region; no lower extremity edema  Imaging: Ct Abdomen Pelvis W Contrast  Result Date: 08/03/2018 CLINICAL DATA:  Diffuse abdominal pain for 1 day. EXAM: CT ABDOMEN  AND PELVIS WITH CONTRAST TECHNIQUE: Multidetector CT imaging of the abdomen and pelvis was performed using the standard protocol following bolus administration of intravenous contrast. CONTRAST:  15mL ISOVUE-300 IOPAMIDOL (ISOVUE-300) INJECTION 61% COMPARISON:  None FINDINGS: Lower chest: No acute abnormality. Hepatobiliary: No focal liver abnormality is seen. No gallstones, gallbladder wall thickening, or biliary dilatation. Pancreas: Unremarkable. No pancreatic ductal dilatation or surrounding inflammatory changes.  Spleen: Normal in size without focal abnormality. Adrenals/Urinary Tract: Normal appearance of the adrenal glands. Within the left mid kidney there is a calcification measuring 9 mm, image 52/4. Multiple lower pole right renal calculi are identified. The largest measures 6 mm, image 45/4. There is right-sided hydronephrosis and hydroureter. At the right UVJ there is a large stone which measures 1.7 cm, image 42/4. More distally there is a right mid ureteral calculus measuring 1.2 cm, image 39/4. These appear to have been present on plain film radiographs of the lumbar spine from 09/11/2015. Urinary bladder appears normal. Stomach/Bowel: Stomach appears normal. The small bowel loops have a normal course and caliber without obstruction. The appendix is visualized and appears normal. Unremarkable appearance of the colon. Vascular/Lymphatic: Normal appearance of the abdominal aorta. No enlarged retroperitoneal or mesenteric adenopathy. No enlarged pelvic or inguinal lymph nodes. Reproductive: The uterus appears normal. Right ovarian dermoid is identified measuring 5.6 by 5.0 by 5.2 cm. Normal appearance of the left ovary. Other: Trace free fluid noted within the pelvis. Musculoskeletal: No acute or significant osseous findings. IMPRESSION: 1. Right-sided hydronephrosis and hydroureter noted. At the right UPJ there is a stone measuring 1.7 cm. More distally there is a right ureteral calculus measuring 1.2 cm. 2. Bilateral renal calculi. 3. 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   On: 08/03/2018 12:24   Nm Parathyroid W/spect  Result Date: 08/07/2018 CLINICAL DATA:  Hypercalcemia, kidney stones; PTH =662 EXAM: NM PARATHYROID SCINTIGRAPHY AND SPECT IMAGING TECHNIQUE: Following intravenous administration of radiopharmaceutical, early and 2-hour delayed planar images were obtained in the anterior projection. Delayed triplanar SPECT images were also obtained at 2 hours.  RADIOPHARMACEUTICALS:  23.3 mCi Tc-15m Sestamibi IV COMPARISON:  None FINDINGS: Planar imaging: Initial images demonstrate symmetric uptake of tracer within the thyroid lobes. Additional large area of increased tracer localization is seen at the inferior aspect of the LEFT thyroid lobe extending caudally. Delayed images demonstrate washout of tracer from the thyroid lobes with persistent abnormal increased tracer localization at the asymmetric area extending inferiorly at the base of the LEFT cervical region. SPECT imaging: Significant large nodular area of abnormal tracer retention identified at the inferior LEFT cervical region extending into the superior mediastinum consistent with a LEFT inferior parathyroid adenoma. No abnormal tracer retention at the sites of the remaining parathyroid glands. IMPRESSION: Abnormal exam demonstrating persistent abnormal sestamibi retention at the expected position of the LEFT inferior parathyroid gland and extending into the LEFT superior mediastinum consistent with a large LEFT inferior parathyroid adenoma. Electronically Signed   By: Lavonia Dana M.D.   On: 08/07/2018 16:25    Labs:  CBC: Recent Labs    08/03/18 1055  WBC 9.2  HGB 14.1  HCT 45.0  PLT 288    COAGS: No results for input(s): INR, APTT in the last 8760 hours.  BMP: Recent Labs    08/03/18 1055  NA 139  K 3.8  CL 110  CO2 24  GLUCOSE 80  BUN 7  CALCIUM 11.6*  CREATININE 0.65  GFRNONAA >60  GFRAA >  60    LIVER FUNCTION TESTS: Recent Labs    08/03/18 1055  BILITOT 0.5  AST 17  ALT 13  ALKPHOS 229*  PROT 6.9  ALBUMIN 3.7    TUMOR MARKERS: No results for input(s): AFPTM, CEA, CA199, CHROMGRNA in the last 8760 hours.  Assessment and Plan:  32 year old female with history of symptomatic nephrolithiasis and recent CT revealing : 1. Right-sided hydronephrosis and hydroureter noted. At the right UPJ there is a stone measuring 1.7 cm. More distally there is a right ureteral  calculus measuring 1.2 cm. 2. Bilateral renal calculi. 3. 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   All of the patient's questions were answered, patient is agreeable to proceed.  Consent signed and in chart.  LABS PENDING    Thank you for this interesting consult.  I greatly enjoyed meeting Divinity Kyler and look forward to participating in their care.  A copy of this report was sent to the requesting provider on this date.  Electronically Signed: D. Rowe Robert, PA-C 08/10/2018, 10:41 AM   I spent a total of  25 minutes   in face to face in clinical consultation, greater than 50% of which was counseling/coordinating care for right percutaneous nephrostomy

## 2018-08-10 NOTE — H&P (Signed)
CC: I have kidney stones.  HPI: Tonya Brennan is a 32 year-old female patient who is here for renal calculi.    Tonya Brennan is a 32 yo AAF who had the onset of some sharp right side pain 2 weeks ago. She saw a chiropractor who got a film this AM and saw a stone. She was sent to [place] and a CT showed a 17mm right UPJ stone with some obstruction and a 81mm proximal ureteral stone. She had nausea and vomiting 2 days ago. She has had no hematuria. She had frequency and nocturia x 6. That has been going on for a year. Her Calcium level was 11.6 today. She has had no prior stones or UTI's.      ALLERGIES: Percocet - Other Reaction, "upset stomach"    MEDICATIONS: Ibuprofen     GU PSH: None   NON-GU PSH: None   GU PMH: None   NON-GU PMH: None   FAMILY HISTORY: Diabetes - Father   SOCIAL HISTORY: Marital Status: Single Preferred Language: English; Ethnicity: Not Hispanic Or Latino; Race: Black or African American Current Smoking Status: Patient smokes. Has smoked since 07/24/2008.   Tobacco Use Assessment Completed: Used Tobacco in last 30 days? Does not use smokeless tobacco. Drinks 1 drink per day.  Patient uses recreational drugs. Uses marijuana. Does not drink caffeine. Patient's occupation Estate manager/land agent.    REVIEW OF SYSTEMS:    GU Review Female:   painful intercourse . Patient reports frequent urination, hard to postpone urination, and get up at night to urinate. Patient denies burning /pain with urination, leakage of urine, stream starts and stops, trouble starting your stream, have to strain to urinate, and being pregnant.  Gastrointestinal (Upper):   Patient reports nausea and vomiting. Patient denies indigestion/ heartburn.  Gastrointestinal (Lower):   Patient denies diarrhea and constipation.  Constitutional:   Patient denies fever, night sweats, weight loss, and fatigue.  Skin:   Patient denies skin rash/ lesion and itching.  Eyes:   Patient reports blurred vision.  Patient denies double vision.  Ears/ Nose/ Throat:   Patient denies sore throat and sinus problems.  Hematologic/Lymphatic:   Patient denies swollen glands and easy bruising.  Cardiovascular:   Patient denies leg swelling and chest pains.  Respiratory:   Patient denies cough and shortness of breath.  Endocrine:   Patient denies excessive thirst.  Musculoskeletal:   Patient reports back pain. Patient denies joint pain.  Neurological:   Patient reports headaches. Patient denies dizziness.  Psychologic:   Patient denies depression and anxiety.   VITAL SIGNS:      08/03/2018 02:29 PM  Weight 175 lb / 79.38 kg  Height 66 in / 167.64 cm  BP 135/100 mmHg  Pulse 83 /min  Temperature 97.3 F / 36.2 C  BMI 28.2 kg/m   MULTI-SYSTEM PHYSICAL EXAMINATION:    Constitutional: Well-nourished. No physical deformities. Normally developed. Good grooming.  Neck: Neck symmetrical, not swollen. Normal tracheal position.  Respiratory: Normal breath sounds. No labored breathing, no use of accessory muscles.   Cardiovascular: Regular rate and rhythm. No murmur, no gallop.   Lymphatic: No enlargement, no tenderness of supraclavicular, neck lymph nodes.  Skin: No paleness, no jaundice, no cyanosis. No lesion, no ulcer, no rash.  Neurologic / Psychiatric: Oriented to time, oriented to place, oriented to person. No depression, no anxiety, no agitation.  Gastrointestinal: Abdominal tenderness RUQ and RCVAT. No mass, no rigidity, non obese abdomen.   Musculoskeletal: Normal gait and station  of head and neck.     PAST DATA REVIEWED:  Source Of History:  Patient  Lab Test Review:   CMP  Records Review:   Previous Hospital Records  Urine Test Review:   Urinalysis  X-Ray Review: [facility] Protocol: Reviewed Films. Reviewed Report. Discussed With Patient.    Notes:                     ER records reviewed.    PROCEDURES:          Urinalysis - 81003 Dipstick Dipstick Cont'd  Color: Yellow Bilirubin: Neg   Appearance: Clear Ketones: Neg  Specific Gravity: 1.010 Blood: Neg  pH: 7.5 Protein: Neg  Glucose: Neg Urobilinogen: 0.2    Nitrites: Neg    Leukocyte Esterase: Neg    Notes:      ASSESSMENT:      ICD-10 Details  1 GU:   Ureteral calculus - N20.1 She has a 72mm right midureteral stone and a 53mm right UPJ stone with obstruction. I discussed ESWL, URS and PCNL and will get her set up for PCNL with an upper pole access. I reviewed the risks of bleeding, infection, injury to the kidney and adjacent structures, renal loss, need for secondary procedures, urine leaks, thrombotic events and anesthetic complications.   2   Renal calculus - N20.0   3 NON-GU:   Other ovarian cyst, right side - N83.291 I am going to reach out to Gyn Onc about management of the teratoma.   4   Hypercalcemia - E83.52 I am going to get a PTH and calcium level.   PLAN:            Medications New Meds: Hydrocodone-Acetaminophen 5 mg-325 mg tablet 1 tablet PO Q 6 H PRN   #15  0 Refill(s)            Orders Labs PTH w/Calcium          Schedule Return Visit/Planned Activity: ASAP - Schedule Surgery             Note: PNCL for right UPJ and ureteral stones.

## 2018-08-10 NOTE — Procedures (Signed)
Pre Procedure Dx: Right sided renal stones Post Procedural Dx: Same  Successful [place] and fluoroscopic guided placement of a right sided nephroureteral catheter via dilated posterior superior calyx, past both obstructing ureteral stones with tip within the urinary bladder.  Nephroureteral catheter was capped.   EBL: Minimal  Complications: None immediate.  Ronny Bacon, MD Pager #: [phone]

## 2018-08-11 ENCOUNTER — Observation Stay (HOSPITAL_COMMUNITY): Payer: Self-pay

## 2018-08-11 ENCOUNTER — Ambulatory Visit (HOSPITAL_COMMUNITY): Admission: RE | Admit: 2018-08-11 | Payer: Self-pay | Source: Ambulatory Visit | Admitting: Urology

## 2018-08-11 ENCOUNTER — Encounter (HOSPITAL_COMMUNITY): Payer: Self-pay

## 2018-08-11 ENCOUNTER — Encounter (HOSPITAL_COMMUNITY)
Admission: RE | Disposition: A | Discharge status: Home / Self Care | Payer: Self-pay | Source: Ambulatory Visit | Attending: Urology

## 2018-08-11 ENCOUNTER — Observation Stay (HOSPITAL_COMMUNITY): Payer: Self-pay | Admitting: Certified Registered Nurse Anesthetist

## 2018-08-11 HISTORY — PX: NEPHROLITHOTOMY: SHX5134

## 2018-08-11 LAB — ABO/RH: ABO/RH(D): O POS

## 2018-08-11 LAB — HEMOGLOBIN AND HEMATOCRIT, BLOOD
HCT: 43.6 % (ref 36.0–46.0)
HEMOGLOBIN: 13.4 g/dL (ref 12.0–15.0)

## 2018-08-11 LAB — SURGICAL PCR SCREEN
MRSA, PCR: NEGATIVE
Staphylococcus aureus: NEGATIVE

## 2018-08-11 LAB — TYPE AND SCREEN
ABO/RH(D): O POS
ANTIBODY SCREEN: NEGATIVE

## 2018-08-11 SURGERY — NEPHROLITHOTOMY PERCUTANEOUS
Anesthesia: General | Laterality: Right

## 2018-08-11 MED ORDER — PROPOFOL 10 MG/ML IV BOLUS
INTRAVENOUS | Status: AC
  Filled 2018-08-11: qty 20

## 2018-08-11 MED ORDER — MIDAZOLAM HCL 2 MG/2ML IJ SOLN
INTRAMUSCULAR | Status: AC
  Filled 2018-08-11: qty 2

## 2018-08-11 MED ORDER — ESMOLOL HCL 100 MG/10ML IV SOLN
INTRAVENOUS | Status: AC
  Filled 2018-08-11: qty 10

## 2018-08-11 MED ORDER — METOPROLOL TARTRATE 5 MG/5ML IV SOLN
INTRAVENOUS | Status: DC | PRN
  Administered 2018-08-11: 1 mg via INTRAVENOUS
  Administered 2018-08-11 (×2): 2 mg via INTRAVENOUS

## 2018-08-11 MED ORDER — STERILE WATER FOR IRRIGATION IR SOLN
Status: DC | PRN
  Administered 2018-08-11: 250 mL

## 2018-08-11 MED ORDER — SUGAMMADEX SODIUM 500 MG/5ML IV SOLN
INTRAVENOUS | Status: DC | PRN
  Administered 2018-08-11: 300 mg via INTRAVENOUS

## 2018-08-11 MED ORDER — HYDROMORPHONE HCL 1 MG/ML IJ SOLN: 0.2500 mg | INTRAMUSCULAR | Status: DC | PRN

## 2018-08-11 MED ORDER — ACETAMINOPHEN 10 MG/ML IV SOLN
1000.0000 mg | Freq: Four times a day (QID) | INTRAVENOUS | Status: DC
  Administered 2018-08-11 – 2018-08-12 (×3): 1000 mg via INTRAVENOUS
  Filled 2018-08-11 (×4): qty 100

## 2018-08-11 MED ORDER — CEFAZOLIN SODIUM-DEXTROSE 2-4 GM/100ML-% IV SOLN
2.0000 g | INTRAVENOUS | Status: AC
  Administered 2018-08-11: 2 g via INTRAVENOUS

## 2018-08-11 MED ORDER — DEXAMETHASONE SODIUM PHOSPHATE 10 MG/ML IJ SOLN
INTRAMUSCULAR | Status: DC | PRN
  Administered 2018-08-11: 10 mg via INTRAVENOUS

## 2018-08-11 MED ORDER — FENTANYL CITRATE (PF) 100 MCG/2ML IJ SOLN
INTRAMUSCULAR | Status: DC | PRN
  Administered 2018-08-11 (×2): 100 ug via INTRAVENOUS
  Administered 2018-08-11: 50 ug via INTRAVENOUS

## 2018-08-11 MED ORDER — FENTANYL CITRATE (PF) 250 MCG/5ML IJ SOLN
INTRAMUSCULAR | Status: AC
  Filled 2018-08-11: qty 5

## 2018-08-11 MED ORDER — ESMOLOL HCL 100 MG/10ML IV SOLN
INTRAVENOUS | Status: DC | PRN
  Administered 2018-08-11: 30 mg via INTRAVENOUS
  Administered 2018-08-11: 20 mg via INTRAVENOUS
  Administered 2018-08-11: 30 mg via INTRAVENOUS
  Administered 2018-08-11: 20 mg via INTRAVENOUS

## 2018-08-11 MED ORDER — KETAMINE HCL 10 MG/ML IJ SOLN
INTRAMUSCULAR | Status: DC | PRN
  Administered 2018-08-11: 30 mg via INTRAVENOUS
  Administered 2018-08-11: 10 mg via INTRAVENOUS

## 2018-08-11 MED ORDER — DEXAMETHASONE SODIUM PHOSPHATE 10 MG/ML IJ SOLN
INTRAMUSCULAR | Status: AC
  Filled 2018-08-11: qty 1

## 2018-08-11 MED ORDER — LACTATED RINGERS IV SOLN: INTRAVENOUS | Status: DC

## 2018-08-11 MED ORDER — IOHEXOL 300 MG/ML  SOLN
INTRAMUSCULAR | Status: DC | PRN
  Administered 2018-08-11: 38 mL

## 2018-08-11 MED ORDER — PROPOFOL 10 MG/ML IV BOLUS
INTRAVENOUS | Status: DC | PRN
  Administered 2018-08-11: 50 mg via INTRAVENOUS
  Administered 2018-08-11: 150 mg via INTRAVENOUS

## 2018-08-11 MED ORDER — DIPHENHYDRAMINE HCL 50 MG/ML IJ SOLN
INTRAMUSCULAR | Status: AC
  Filled 2018-08-11: qty 1

## 2018-08-11 MED ORDER — ONDANSETRON HCL 4 MG/2ML IJ SOLN
INTRAMUSCULAR | Status: AC
  Filled 2018-08-11: qty 2

## 2018-08-11 MED ORDER — CEFAZOLIN SODIUM-DEXTROSE 2-4 GM/100ML-% IV SOLN
2.0000 g | Freq: Three times a day (TID) | INTRAVENOUS | Status: DC
  Administered 2018-08-11 – 2018-08-12 (×2): 2 g via INTRAVENOUS
  Filled 2018-08-11 (×4): qty 100

## 2018-08-11 MED ORDER — METOPROLOL TARTRATE 5 MG/5ML IV SOLN
INTRAVENOUS | Status: AC
  Filled 2018-08-11: qty 5

## 2018-08-11 MED ORDER — HYDROMORPHONE HCL 1 MG/ML IJ SOLN
0.5000 mg | INTRAMUSCULAR | Status: DC | PRN
  Administered 2018-08-11: 1 mg via INTRAVENOUS
  Administered 2018-08-12: 0.5 mg via INTRAVENOUS
  Filled 2018-08-11 (×3): qty 1

## 2018-08-11 MED ORDER — KETAMINE HCL 10 MG/ML IJ SOLN
INTRAMUSCULAR | Status: AC
  Filled 2018-08-11: qty 1

## 2018-08-11 MED ORDER — LIDOCAINE 2% (20 MG/ML) 5 ML SYRINGE
INTRAMUSCULAR | Status: AC
  Filled 2018-08-11: qty 5

## 2018-08-11 MED ORDER — LACTATED RINGERS IV SOLN
INTRAVENOUS | Status: DC | PRN
  Administered 2018-08-11: 11:00:00 via INTRAVENOUS

## 2018-08-11 MED ORDER — LIDOCAINE 2% (20 MG/ML) 5 ML SYRINGE
INTRAMUSCULAR | Status: DC | PRN
  Administered 2018-08-11: 100 mg via INTRAVENOUS

## 2018-08-11 MED ORDER — DIPHENHYDRAMINE HCL 50 MG/ML IJ SOLN
INTRAMUSCULAR | Status: DC | PRN
  Administered 2018-08-11: 12.5 mg via INTRAVENOUS

## 2018-08-11 MED ORDER — ONDANSETRON HCL 4 MG/2ML IJ SOLN: 4.0000 mg | INTRAMUSCULAR | Status: DC | PRN

## 2018-08-11 MED ORDER — ROCURONIUM BROMIDE 100 MG/10ML IV SOLN
INTRAVENOUS | Status: AC
  Filled 2018-08-11: qty 1

## 2018-08-11 MED ORDER — PHENYLEPHRINE 40 MCG/ML (10ML) SYRINGE FOR IV PUSH (FOR BLOOD PRESSURE SUPPORT)
PREFILLED_SYRINGE | INTRAVENOUS | Status: DC | PRN
  Administered 2018-08-11: 80 ug via INTRAVENOUS

## 2018-08-11 MED ORDER — MEPERIDINE HCL 50 MG/ML IJ SOLN: 6.2500 mg | INTRAMUSCULAR | Status: DC | PRN

## 2018-08-11 MED ORDER — LABETALOL HCL 5 MG/ML IV SOLN
5.0000 mg | INTRAVENOUS | Status: DC | PRN
  Administered 2018-08-11 (×2): 5 mg via INTRAVENOUS

## 2018-08-11 MED ORDER — HYDROMORPHONE HCL 1 MG/ML IJ SOLN
INTRAMUSCULAR | Status: AC
  Filled 2018-08-11: qty 1

## 2018-08-11 MED ORDER — SUGAMMADEX SODIUM 500 MG/5ML IV SOLN
INTRAVENOUS | Status: AC
  Filled 2018-08-11: qty 5

## 2018-08-11 MED ORDER — MIDAZOLAM HCL 5 MG/5ML IJ SOLN
INTRAMUSCULAR | Status: DC | PRN
  Administered 2018-08-11: 2 mg via INTRAVENOUS

## 2018-08-11 MED ORDER — ROCURONIUM BROMIDE 50 MG/5ML IV SOSY
PREFILLED_SYRINGE | INTRAVENOUS | Status: DC | PRN
  Administered 2018-08-11: 20 mg via INTRAVENOUS
  Administered 2018-08-11: 50 mg via INTRAVENOUS

## 2018-08-11 MED ORDER — SODIUM CHLORIDE 0.9 % IR SOLN
Status: DC | PRN
  Administered 2018-08-11: 27000 mL

## 2018-08-11 MED ORDER — LABETALOL HCL 5 MG/ML IV SOLN
INTRAVENOUS | Status: AC
  Administered 2018-08-11: 5 mg via INTRAVENOUS
  Filled 2018-08-11: qty 4

## 2018-08-11 MED ORDER — SCOPOLAMINE 1 MG/3DAYS TD PT72
MEDICATED_PATCH | TRANSDERMAL | Status: AC
  Filled 2018-08-11: qty 1

## 2018-08-11 MED ORDER — HYDROCODONE-ACETAMINOPHEN 5-325 MG PO TABS: 1.0000 | ORAL_TABLET | Freq: Four times a day (QID) | ORAL | 0 refills | Status: AC | PRN

## 2018-08-11 MED ORDER — 0.9 % SODIUM CHLORIDE (POUR BTL) OPTIME
TOPICAL | Status: DC | PRN
  Administered 2018-08-11: 1000 mL

## 2018-08-11 MED ORDER — PHENYLEPHRINE 40 MCG/ML (10ML) SYRINGE FOR IV PUSH (FOR BLOOD PRESSURE SUPPORT)
PREFILLED_SYRINGE | INTRAVENOUS | Status: AC
  Filled 2018-08-11: qty 10

## 2018-08-11 MED ORDER — SCOPOLAMINE 1 MG/3DAYS TD PT72
MEDICATED_PATCH | TRANSDERMAL | Status: DC | PRN
  Administered 2018-08-11: 1 via TRANSDERMAL

## 2018-08-11 MED ORDER — SODIUM CHLORIDE 0.9% IV SOLUTION: Freq: Once | INTRAVENOUS | Status: DC

## 2018-08-11 MED ORDER — PROMETHAZINE HCL 25 MG/ML IJ SOLN: 6.2500 mg | INTRAMUSCULAR | Status: DC | PRN

## 2018-08-11 MED ORDER — LACTATED RINGERS IV SOLN
INTRAVENOUS | Status: DC
  Administered 2018-08-11: 10:00:00 via INTRAVENOUS

## 2018-08-11 SURGICAL SUPPLY — 49 items
BAG URINE DRAINAGE (UROLOGICAL SUPPLIES) IMPLANT
BASKET STONE NCOMPASS (UROLOGICAL SUPPLIES) ×3 IMPLANT
BASKET STONE NITINOL 3FRX115MB (UROLOGICAL SUPPLIES) ×3 IMPLANT
BASKET ZERO TIP NITINOL 2.4FR (BASKET) IMPLANT
BENZOIN TINCTURE PRP APPL 2/3 (GAUZE/BANDAGES/DRESSINGS) ×3 IMPLANT
BLADE SURG 15 STRL LF DISP TIS (BLADE) ×1 IMPLANT
BLADE SURG 15 STRL SS (BLADE) ×2
CATH AINSWORTH 30CC 24FR (CATHETERS) IMPLANT
CATH COUNCIL 22FR (CATHETERS) ×3 IMPLANT
CATH ROBINSON RED A/P 20FR (CATHETERS) IMPLANT
CATH URET 5FR 28IN OPEN ENDED (CATHETERS) IMPLANT
CATH URET DUAL LUMEN 6-10FR 50 (CATHETERS) ×3 IMPLANT
CATH UROLOGY TORQUE 40 (MISCELLANEOUS) ×3 IMPLANT
CATH X-FORCE N30 NEPHROSTOMY (TUBING) ×3 IMPLANT
COVER SURGICAL LIGHT HANDLE (MISCELLANEOUS) ×3 IMPLANT
COVER WAND RF STERILE (DRAPES) IMPLANT
DRAPE C-ARM 42X120 X-RAY (DRAPES) ×3 IMPLANT
DRAPE LINGEMAN PERC (DRAPES) ×3 IMPLANT
DRAPE SURG IRRIG POUCH 19X23 (DRAPES) ×3 IMPLANT
DRSG PAD ABDOMINAL 8X10 ST (GAUZE/BANDAGES/DRESSINGS) ×6 IMPLANT
DRSG TEGADERM 8X12 (GAUZE/BANDAGES/DRESSINGS) ×6 IMPLANT
EXTRACTOR STONE NITINOL NGAGE (UROLOGICAL SUPPLIES) IMPLANT
FIBER LASER FLEXIVA 365 (UROLOGICAL SUPPLIES) IMPLANT
FIBER LASER TRAC TIP (UROLOGICAL SUPPLIES) IMPLANT
GAUZE SPONGE 4X4 12PLY STRL (GAUZE/BANDAGES/DRESSINGS) ×3 IMPLANT
GLOVE SURG SS PI 8.0 STRL IVOR (GLOVE) ×3 IMPLANT
GOWN STRL REUS W/TWL XL LVL3 (GOWN DISPOSABLE) ×3 IMPLANT
GUIDEWIRE AMPLAZ .035X145 (WIRE) ×6 IMPLANT
KIT BALLIN UROMAX 15FX10 (LABEL) ×1 IMPLANT
KIT BASIN OR (CUSTOM PROCEDURE TRAY) ×3 IMPLANT
KIT PROBE TRILOGY 3.9X350 (MISCELLANEOUS) ×3 IMPLANT
MANIFOLD NEPTUNE II (INSTRUMENTS) ×3 IMPLANT
NS IRRIG 1000ML POUR BTL (IV SOLUTION) ×3 IMPLANT
PACK CYSTO (CUSTOM PROCEDURE TRAY) ×3 IMPLANT
PAD ABD 8X10 STRL (GAUZE/BANDAGES/DRESSINGS) ×3 IMPLANT
PROBE LITHOCLAST ULTRA 3.8X403 (UROLOGICAL SUPPLIES) IMPLANT
PROBE PNEUMATIC 1.0MMX570MM (UROLOGICAL SUPPLIES) IMPLANT
SET HIGH PRES BAL DIL (LABEL) ×2
SET IRRIG Y TYPE TUR BLADDER L (SET/KITS/TRAYS/PACK) IMPLANT
SPONGE LAP 4X18 RFD (DISPOSABLE) ×3 IMPLANT
SUT SILK 2 0 30  PSL (SUTURE) ×4
SUT SILK 2 0 30 PSL (SUTURE) ×2 IMPLANT
SYR 10ML LL (SYRINGE) ×3 IMPLANT
SYR 20CC LL (SYRINGE) ×6 IMPLANT
TOWEL OR 17X26 10 PK STRL BLUE (TOWEL DISPOSABLE) ×3 IMPLANT
TOWEL OR NON WOVEN STRL DISP B (DISPOSABLE) ×3 IMPLANT
TRAY FOLEY MTR SLVR 16FR STAT (SET/KITS/TRAYS/PACK) ×3 IMPLANT
TUBING CONNECTING 10 (TUBING) ×4 IMPLANT
TUBING CONNECTING 10' (TUBING) ×2

## 2018-08-11 NOTE — Anesthesia Procedure Notes (Signed)
Procedure Name: Intubation Date/Time: 08/11/2018 10:37 AM Performed by: Deliah Boston, CRNA Pre-anesthesia Checklist: Patient identified, Emergency Drugs available, Suction available and Patient being monitored Patient Re-evaluated:Patient Re-evaluated prior to induction Oxygen Delivery Method: Circle system utilized Preoxygenation: Pre-oxygenation with 100% oxygen Induction Type: IV induction Ventilation: Mask ventilation without difficulty Laryngoscope Size: Mac and 3 Grade View: Grade I Tube type: Oral Tube size: 7.0 mm Number of attempts: 1 Airway Equipment and Method: Stylet and Oral airway Placement Confirmation: ETT inserted through vocal cords under direct vision,  positive ETCO2 and breath sounds checked- equal and bilateral Secured at: 23 cm Tube secured with: Tape Dental Injury: Teeth and Oropharynx as per pre-operative assessment

## 2018-08-11 NOTE — Transfer of Care (Signed)
Immediate Anesthesia Transfer of Care Note  Patient: Tonya Brennan  Procedure(s) Performed: Procedure(s): NEPHROLITHOTOMY PERCUTANEOUS (Right)  Patient Location: PACU  Anesthesia Type:General  Level of Consciousness: Patient easily awoken, sedated, comfortable, cooperative, following commands, responds to stimulation.   Airway & Oxygen Therapy: Patient spontaneously breathing, ventilating well, oxygen via simple oxygen mask.  Post-op Assessment: Report given to PACU RN, vital signs reviewed and stable, moving all extremities.   Post vital signs: Reviewed and stable.  Complications: No apparent anesthesia complications  Last Vitals:  Vitals Value Taken Time  BP 153/111 08/11/2018  1:19 PM  Temp    Pulse 93 08/11/2018  1:25 PM  Resp 18 08/11/2018  1:25 PM  SpO2 99 % 08/11/2018  1:25 PM  Vitals shown include unvalidated device data.  Last Pain:  Vitals:   08/11/18 0946  TempSrc: Oral  PainSc: 6       Patients Stated Pain Goal: 3 (46/27/03 5009)  Complications: No apparent anesthesia complications

## 2018-08-11 NOTE — Progress Notes (Signed)
Urology Progress Note   Subjective: Pain well controlled last night until this AM around 5 AM when patient started experiencing new right shoulder pain and dyspnea. She was tachycardic and hypertensive. CXR demonstrated atelectasis and EKG was sinus tachycardia. Pain was treated and patient symptomatically improved as well as demonstrating normalization of her vital signs.   Objective: Vital signs in last 24 hours: Temp:  [97.5 F (36.4 C)-98.8 F (37.1 C)] 98.3 F (36.8 C) (11/19 0453) Pulse Rate:  [69-114] 90 (11/19 0639) Resp:  [12-21] 20 (11/19 0453) BP: (129-163)/(86-130) 137/89 (11/19 0639) SpO2:  [97 %-100 %] 100 % (11/19 0453)  Intake/Output from previous day: 11/18 0701 - 11/19 0700 In: 720 [P.O.:720] Out: 650 [Urine:650] Intake/Output this shift: No intake/output data recorded.  Physical Exam:  General: Alert and oriented CV: RRR Lungs: NWOB Abdomen: Soft, appropriately tender.  GU: voiding spontaneously Ext: NT, No erythema  Lab Results: Recent Labs    08/10/18 1053  HGB 15.0  HCT 47.5*   BMET Recent Labs    08/10/18 1053  NA 138  K 4.1  CL 110  CO2 21*  GLUCOSE 90  BUN 8  CREATININE 0.69  CALCIUM 12.3*     Studies/Results: Dg Chest Port 1 View  Result Date: 08/11/2018 CLINICAL DATA:  Increasing shortness of breath and right-sided chest pressure. EXAM: PORTABLE CHEST 1 VIEW COMPARISON:  08/18/2016 FINDINGS: Shallow inspiration with linear atelectasis in the lung bases. Normal heart size and pulmonary vascularity. No airspace disease or consolidation. No blunting of costophrenic angles. No pneumothorax. Mediastinal contours appear intact. IMPRESSION: Shallow inspiration with linear atelectasis in the lung bases. Electronically Signed   By: Lucienne Capers M.D.   On: 08/11/2018 05:40   Ir Ureteral Stent Right New Access W/o Sep Nephrostomy Cath  Result Date: 08/10/2018 INDICATION: History of obstructing right-sided ureteral stones. Please  perform image guided nephrostomy catheter placement for impending percutaneous nephrolithotomy. Please obtain upper pole access if possible (urology request). EXAM: 1. ULTRASOUND-GUIDED FOR PUNCTURE OF THE RIGHT SIDED RENAL COLLECTING SYSTEM. 2. FLUOROSCOPIC GUIDED PLACEMENT OF A RIGHT SIDED NEPHROURETERAL CATHETER. COMPARISON:  CT of the abdomen and pelvis - 08/03/2018 MEDICATIONS: Ancef 2 gm IV; The antibiotic was administered in an appropriate time frame prior to skin puncture. ANESTHESIA/SEDATION: Moderate (conscious) sedation was employed during this procedure. A total of Versed 8 mg and Fentanyl 300 mcg was administered intravenously. Moderate Sedation Time: 38 minutes. The patient's level of consciousness and vital signs were monitored continuously by radiology nursing throughout the procedure under my direct supervision. CONTRAST:  20 cc Isovue-300 - administered into the renal collecting system. FLUOROSCOPY TIME:  11 minutes, 42 seconds (947 mGy) COMPLICATIONS: None immediate. PROCEDURE: Informed written consent was obtained from the patient after a discussion of the risks, benefits, and alternatives to treatment. The right flank region was prepped with Betadine in a sterile fashion, and a sterile drape was applied covering the operative field. A sterile gown and sterile gloves were used for the procedure. A timeout was performed prior to the initiation of the procedure. A pre procedural spot fluoroscopic image was obtained of the upper abdomen. Ultrasound scanning performed of the kidney demonstrated moderate to marked dilatation primarily involving the superior pole the right kidney as demonstrated on preceding abdominal CT. Under direct ultrasound guidance, a dilated posterosuperior calyx was targeted with a 22 gauge Chiba needle after the overlying soft tissues were anesthetized with 1% lidocaine. Access to the collecting system was confirmed with the injection of a small amount  of contrast. Next, the  access needle was exchanged for a hydrophilic Accustick set under fluoroscopic guidance. Contrast injection confirmed appropriate access. Next, utilizing a regular glidewire and ultimately roadrunner wire a 4 French angled glide catheter was advanced beyond both obstructing right-sided ureteral stones the level of the urinary bladder via the outer sheath from the [place] set. Contrast injection confirmed appropriate positioning. Next, over a Amplatz wire, the access system was removed and the 4 French angled glide catheter was re-advanced to the level of the urinary bladder. Contrast injection confirmed appropriate positioning. The catheter was secured at the skin entrance site with an interrupted suture. Postprocedural spot fluoroscopic radiograph was obtained. The catheter was capped and a dressing was placed. The patient tolerated the procedure well without immediate post procedural complication. FINDINGS: Pre procedural spot radiographic images demonstrates unchanged size and appearance of known obstructing stones within the superior and mid/distal aspect of the ureter. Sonographic evaluation demonstrates moderate dilatation of the right renal collecting system as demonstrated on preceding abdominal CT. A dilated posterosuperior calyx was targeted via ultrasound guidance allowing fluoroscopic guided placement of a 4 French angled glide catheter past both ureteral stones with tip ultimately terminating with the urinary bladder. IMPRESSION: Successful ultrasound and fluoroscopic guided placement of a right sided 4 French angled glide catheter past both obstructing ureteral stones to the level of the urinary bladder to be utilized during impending nephrolithotomy procedure. Electronically Signed   By: Sandi Mariscal M.D.   On: 08/10/2018 14:05    Assessment/Plan:  32 y.o. female s/p VIR access for a right PCNL.  Overall doing well.   - OR today for right PCNL - NPO until OR today. - Per patient's mom's  request, will have case management meet with patient.  - Marked for OR tomorrow on right flank.   Dispo: Possible discharge tomorrow or Thursday pending how patient recovers from PCNL today.    LOS: 0 days   Dorothey Baseman 08/11/2018, 7:11 AM

## 2018-08-11 NOTE — Anesthesia Preprocedure Evaluation (Addendum)
Anesthesia Evaluation  Patient identified by MRN, date of birth, ID band Patient awake    Reviewed: Allergy & Precautions, NPO status , Patient's Chart, lab work & pertinent test results  Airway Mallampati: II  TM Distance: >3 FB Neck ROM: Full    Dental  (+) Teeth Intact, Dental Advisory Given   Pulmonary former smoker,    breath sounds clear to auscultation       Cardiovascular negative cardio ROS   Rhythm:Regular Rate:Normal     Neuro/Psych negative neurological ROS     GI/Hepatic negative GI ROS, Neg liver ROS,   Endo/Other    Renal/GU      Musculoskeletal negative musculoskeletal ROS (+)   Abdominal Normal abdominal exam  (+)   Peds  Hematology negative hematology ROS (+)   Anesthesia Other Findings   Reproductive/Obstetrics                            Lab Results  Component Value Date   WBC 8.8 08/10/2018   HGB 15.0 08/10/2018   HCT 47.5 (H) 08/10/2018   MCV 93.7 08/10/2018   PLT 288 08/10/2018   Lab Results  Component Value Date   CREATININE 0.69 08/10/2018   BUN 8 08/10/2018   NA 138 08/10/2018   K 4.1 08/10/2018   CL 110 08/10/2018   CO2 21 (L) 08/10/2018   Lab Results  Component Value Date   INR 0.98 08/10/2018   EKG: normal sinus rhythm.  Anesthesia Physical Anesthesia Plan  ASA: II  Anesthesia Plan: General   Post-op Pain Management:    Induction: Intravenous  PONV Risk Score and Plan: 4 or greater and Ondansetron, Dexamethasone, Midazolam and Scopolamine patch - Pre-op  Airway Management Planned: Oral ETT  Additional Equipment: None  Intra-op Plan:   Post-operative Plan: Extubation in OR  Informed Consent: I have reviewed the patients History and Physical, chart, labs and discussed the procedure including the risks, benefits and alternatives for the proposed anesthesia with the patient or authorized representative who has indicated his/her  understanding and acceptance.   Dental advisory given  Plan Discussed with: CRNA  Anesthesia Plan Comments:        Anesthesia Quick Evaluation

## 2018-08-11 NOTE — Progress Notes (Signed)
Patient returned from surgery. Nephrostomy tube site CDI, will continue to monitor. Drainage is bloody clear. Foley intact, urine clear yellow. Pt remains A&Ox4.

## 2018-08-11 NOTE — Progress Notes (Signed)
Patient c/o shortness of breath and "new " pain in her right shoulder. Vitals: 98.74F;HR113;RR20;160/130;100% Room [employer]

## 2018-08-11 NOTE — Op Note (Addendum)
Preoperative Diagnosis: Right renal stone greater than 2 cm   Postoperative Diagnosis: Right renal stone greater than 2 cm    Procedure(s) Performed:   1. Right percutaneous nephrostolithotomy for stone burden greater than 2 cm 2. Antegrade nephrostogram with nephrostomy tube placement 3. Simple Foley catheter placement 4. Intraoperative fluoroscopy with interpretation less than 1 hour   Teaching Surgeon:  Irine Seal, M.D.   Resident Surgeon:  Dorothey Baseman, M.D., Ph.D.   Assistants:  None listed   Anesthesia:  General via endotracheal tube.     IV Fluids:  See Anesthesia record.   Estimated Blood Loss:  100 mLs.   Cultures:  None   Drains:  Right 22 French Councill tip nephrostomy tube with external Kumpe catheter, 62 French Foley catheter, both to drainage.   Specimens: None   Complications:  None.   Indications for Surgery:  Tonya Brennan is a 32 y.o. female with a history of a parathyroid adenoma, hypercalcemia, and nephrolithiasis. The patient was evaluated and noted with a 1.7 cm in diameter Right renal stone and a 1.2 cm right ureteral stone with associated right hydronephrosis and hydroureter. The patient presents today for percutaneous treatment of Right kidney stone. The risks and benefits of the procedure were discussed with the patient who wishes to proceed.   Operative Findings:  - Successful treatment of the entirety of the stone burden.  - Successful placement of nephrostomy tube.  - At the level of the obstructed stone in the right ureter, there was significant ureteral dilation with two small diverticula / false passages alongside the true ureteral lumen, which was narrowed.  - UPJ stone was significantly impacted into the mucosa at the UPJ and mucosal edema was present.   Radiologic Interpretation of Retrograde Pyelogram and Anterograde Nephrostogram: Antegrade ureterogram demonstrated dilated proximal ureter to the level of the impacted ureteral stone was  narrowing of the ureter just distal to where the stone had been impacted (RPG performed after stone treatment). Antegrade nephrostogram post-treatment demonstrated no contrast extravasation with narrowing present at the UPJ. Distal ureter was patent to the bladder.    Procedure:  The patient was correctly identified in the preoperative holding area where written informed consent as well as potential risks and complications were reviewed. The patient was brought back to the operative suite where a preinduction timeout was performed. Once correct information was verified, general anesthesia was induced via endotracheal tube.  The patient was then gently repositioned into the position, paying careful attention to pad all pressure points and affixed the patient to the bed at multiple points of contact. Foley catheter was placed. She was then prepped and draped in the usual sterile fashion and given appropriate perioperative procedural antibiotics.  Sequential compression devices were placed for VTE prophylaxis.  A second timeout was then performed.    A wire was placed through the previously placed IR nephrostomy tube and advanced to the level of the bladder under fluoroscopic guidance. The nephrostomy tube was removed over the wire. Incision was extended to 2 cm. We attempted to pass a dual lumen over the wire to place a second safety wire, but met resistance. A 15 Fr balloon was utilized to dilate the pre-existing tract. Next, using a combination of catheters and dilators, we placed a second safety wire and then developed our percutaneous tract with the advancement of a 30 French x 20 cm Nephromax balloon dilator.  After this, a 42 French sheath was advanced to the edge of the distal calyx  on fluoroscopy.     We then performed rigid nephroscopy with the triology lithotrite. Immediately upon entrance into the collecting system, we encountered the stone and we then proceeded to treat the stone with lithotripsy.  After reaching the limit of the stone capable of being treated with the rigid scope, we then switched to flexible nephroscopy and removed additional stone using the trilogy and graspers. We then advanced the nephroscope into the proximal ureter and identified the ureteral stone with additional findings as above. We attempted to basket the stone out, but could not remove it past the UPJ. We brought the stone to the level of the UPJ and, using a combination of the lithotrite and graspers and basket, removed the entirety of the stone burden. Antegrade ureterogram confirmed patent ureter distally. We repeated nephroscopy and identified no residual stone fragments.    At this point, we elected to leave our nephrostomy tube.  We passed a 22 French Councill tip nephrostomy tube to the level of the UPJ. 3 mL of sterile water was placed into the balloon. Antegrade nephrostogram demonstrated complete filling of the entire renal collecting system with minimal contrast extravasation from the access tract and satisfactory placement of her nephrostomy tube in the renal pelvis. Kumpe catheter was advanced over the wire to the level of the distal ureter under fluoroscopic guidance. The nephrostomy tube was affixed to the patient's skin using a single 0 silk suture in an interrupted fashion. Nephrostomy tube was placed to drainage. Pressure was held and hemostasis confirmed. The site was then dressed in the usual gauze and tape dressing and the patient was carefully returned to supine position.  At this point, the patient was extubated and taken to the recovery area in stable fashion.

## 2018-08-11 NOTE — Anesthesia Postprocedure Evaluation (Signed)
Anesthesia Post Note  Patient: Tonya Brennan  Procedure(s) Performed: NEPHROLITHOTOMY PERCUTANEOUS (Right )     Patient location during evaluation: PACU Anesthesia Type: General Level of consciousness: awake and alert Pain management: pain level controlled Vital Signs Assessment: post-procedure vital signs reviewed and stable Respiratory status: spontaneous breathing, nonlabored ventilation, respiratory function stable and patient connected to nasal cannula oxygen Cardiovascular status: blood pressure returned to baseline and stable Postop Assessment: no apparent nausea or vomiting Anesthetic complications: no    Last Vitals:  Vitals:   08/11/18 1345 08/11/18 1400  BP: (!) 140/107 (!) 136/103  Pulse: 85 84  Resp: 18 18  Temp:    SpO2: 96%     Last Pain:  Vitals:   08/11/18 1400  TempSrc:   PainSc: [place] Shawnika Pepin

## 2018-08-11 NOTE — Progress Notes (Signed)
PCP appointment 08/19/18 at 3:30 PM at [facility] PCP at [facility].  Pt is aware.

## 2018-08-12 ENCOUNTER — Observation Stay (HOSPITAL_COMMUNITY): Payer: Self-pay

## 2018-08-12 ENCOUNTER — Encounter (HOSPITAL_COMMUNITY): Payer: Self-pay | Admitting: Urology

## 2018-08-12 LAB — COMPREHENSIVE METABOLIC PANEL
ALBUMIN: 3.8 g/dL (ref 3.5–5.0)
ALT: 10 U/L (ref 0–44)
ANION GAP: 5 (ref 5–15)
AST: 12 U/L — AB (ref 15–41)
Alkaline Phosphatase: 189 U/L — ABNORMAL HIGH (ref 38–126)
BILIRUBIN TOTAL: 0.9 mg/dL (ref 0.3–1.2)
BUN: 6 mg/dL (ref 6–20)
CHLORIDE: 109 mmol/L (ref 98–111)
CO2: 22 mmol/L (ref 22–32)
Calcium: 12.3 mg/dL — ABNORMAL HIGH (ref 8.9–10.3)
Creatinine, Ser: 0.71 mg/dL (ref 0.44–1.00)
GFR calc Af Amer: >60 mL/min (ref >60)
GLUCOSE: 113 mg/dL — AB (ref 70–99)
Potassium: 4.4 mmol/L (ref 3.5–5.1)
Sodium: 136 mmol/L (ref 135–145)
TOTAL PROTEIN: 7.4 g/dL (ref 6.5–8.1)

## 2018-08-12 LAB — HEMOGLOBIN AND HEMATOCRIT, BLOOD
HEMATOCRIT: 44.7 % (ref 36.0–46.0)
HEMOGLOBIN: 13.9 g/dL (ref 12.0–15.0)

## 2018-08-12 NOTE — Progress Notes (Signed)
Pt was referred to Development worker, community for assistance.

## 2018-08-12 NOTE — Discharge Instructions (Signed)
DISCHARGE INSTRUCTIONS FOR PERCUTANEOUS STONE SURGERY  MEDICATIONS:  1. DO NOT RESUME YOUR IBUPROFEN, or any other medicines like aspirin, motrin, excedrin, advil, aleve, vitamin E, fish oil as these can all cause bleeding x 10 days.  2. Resume all your other meds from home - except do not take any other pain meds that you may have at home.  ACTIVITY 1. No strenuous activity, sexual activity, or lifting greater than 10 pounds for 2 weeks. 2. No driving while on narcotic pain medications 3. Drink plenty of water 4. Continue to walk at home - you can still get blood clots when you are at home, so keep active, but don't over do it. 5. May return to work in 1 week (but not heavy or strenuous activity).   BATHING You can shower.  Cover your wound with a dressing and remove the dressing immediately after the shower.  Do not submerge wound under water.   TUBES: You may be discharged home with tubes present in your back. One of these is likely capped and the other will likely be to a drainage bag. Please care for these tubes as instructed in the hospital prior to discharge. If the tube in your back stops draining and you experience significant flank pain, call our office at the number provided.   WOUND CARE If the tubes in your back were removed prior to discharge, your wound will drain bloody fluid and may do so for 7-14 days. You have 2 options for dressings:  1. You may use gauze and tape to dress your wound.  If you choose this method, then change the dressing as it becomes soaked.  Change it at least once daily until it stops draining. You may switch to a Band Aid once drainage stops. 2. If drainage is copious, you may use an ostomy device.  This is a bag with an andhesive circle.  The circle has a hole in the middle of it and you cut the hole to the size needed to fit the wound.  This will collect the drainage in the bag and allow you to drain the bag as needed.   SIGNS/SYMPTOMS TO  CALL: 1. Please call us if you have a fever greater than 101.5, uncontrolled nausea/vomiting, uncontrolled pain, dizziness, unable to urinate, bloody urine, chest pain, shortness of breath, leg swelling, leg pain, redness around wound, drainage from wound, or any other concerns or questions. 2. You can reach [place] at [phone]. FOLLOW-UP 1.  Follow-up with Dr. Jeffie Pollock as previously scheduled.   I will arrange an x-ray through your tube on Friday prior to tube removal.

## 2018-08-12 NOTE — Progress Notes (Signed)
While reviewing AVS and Home Meds for DC, noted inclusion of BC Powder (Asprin-Salicylamide-Caffeine) included for home meds. DC instructions in AVS explicitly state to avoid taking asprin. Primary RN Notified, HCP paged. Awaiting clarification prior to DC.

## 2018-08-13 ENCOUNTER — Other Ambulatory Visit: Payer: Self-pay | Admitting: Urology

## 2018-08-13 DIAGNOSIS — N200 Calculus of kidney: Secondary | ICD-10-CM

## 2018-08-14 ENCOUNTER — Encounter (HOSPITAL_COMMUNITY): Payer: Self-pay | Admitting: Interventional Radiology

## 2018-08-14 ENCOUNTER — Ambulatory Visit (HOSPITAL_COMMUNITY)
Admission: RE | Admit: 2018-08-14 | Discharge: 2018-08-14 | Disposition: A | Discharge status: Home / Self Care | Payer: Self-pay | Source: Ambulatory Visit | Attending: Urology | Admitting: Urology

## 2018-08-14 DIAGNOSIS — N201 Calculus of ureter: Secondary | ICD-10-CM | POA: Insufficient documentation

## 2018-08-14 DIAGNOSIS — N200 Calculus of kidney: Secondary | ICD-10-CM

## 2018-08-14 HISTORY — PX: IR NEPHROSTOGRAM RIGHT THRU EXISTING ACCESS: IMG6062

## 2018-08-14 MED ORDER — IOPAMIDOL (ISOVUE-300) INJECTION 61%
50.0000 mL | Freq: Once | INTRAVENOUS | Status: AC | PRN
  Administered 2018-08-14: 20 mL

## 2018-08-14 MED ORDER — IOPAMIDOL (ISOVUE-300) INJECTION 61%
INTRAVENOUS | Status: AC
  Administered 2018-08-14: 20 mL
  Filled 2018-08-14: qty 50

## 2018-08-14 NOTE — Discharge Summary (Signed)
Physician Discharge Summary  Patient ID: Tonya Brennan MRN: [id number] DOB/AGE: 34-Mar-1987 34 y.o.  Admit date: 08/10/2018 Discharge date: 08/14/2018  Admission Diagnoses:  Right kidney stone  Discharge Diagnoses:  Principal Problem:   Right kidney stone Active Problems:   Nephrolithiasis   History reviewed. No pertinent past medical history.  Surgeries:  08/10/2018  Right first stage PCNL for >2cm stone burden.  Right antegrade nephrostogram.    Consultants (if any):   Discharged Condition: Improved  Hospital Course: Tonya Brennan is an 32 y.o. female who was admitted 08/10/2018 with a diagnosis of Right kidney stone and went to the operating room on 08/10/2018 and underwent the above named procedures.  She did well postop and her f/u KUB on 11/19 demonstrated a tiny right distal fragment and a small fragement in the area of the renal pelvis where she had adherent stone we couldn't remove.   She was discharged home with the tubes in place and will have a nephrostogram on 11/22 and potentially have the tubes removed.  Marland KitchenShe was given perioperative antibiotics:  Anti-infectives (From admission, onward)   Start     Dose/Rate Route Frequency Ordered Stop   08/11/18 1800  ceFAZolin (ANCEF) IVPB 2g/100 mL premix  Status:  Discontinued     2 g 200 mL/hr over 30 Minutes Intravenous Every 8 hours 08/11/18 1446 08/12/18 1756   08/11/18 0940  ceFAZolin (ANCEF) IVPB 2g/100 mL premix     2 g 200 mL/hr over 30 Minutes Intravenous 30 min pre-op 08/11/18 0940 08/11/18 1039   08/11/18 0930  ceFAZolin (ANCEF) IVPB 2g/100 mL premix  Status:  Discontinued     2 g 200 mL/hr over 30 Minutes Intravenous  Once 08/10/18 1708 08/11/18 1446   08/10/18 1030  ceFAZolin (ANCEF) IVPB 2g/100 mL premix     2 g 200 mL/hr over 30 Minutes Intravenous To Radiology 08/10/18 1007 08/10/18 1240    .  She was given sequential compression devices for DVT prophylaxis.  She benefited maximally from the hospital stay  and there were no complications.    Recent vital signs:  Vitals:   08/12/18 0432 08/12/18 1331  BP: (!) 136/96 131/89  Pulse: (!) 101 69  Resp: 20 18  Temp: 98.5 F (36.9 C) 98.3 F (36.8 C)  SpO2: 100% 100%    Recent laboratory studies:  Lab Results  Component Value Date   HGB 13.9 08/12/2018   HGB 13.4 08/11/2018   HGB 15.0 08/10/2018   Lab Results  Component Value Date   WBC 8.8 08/10/2018   PLT 288 08/10/2018   Lab Results  Component Value Date   INR 0.98 08/10/2018   Lab Results  Component Value Date   NA 136 08/12/2018   K 4.4 08/12/2018   CL 109 08/12/2018   CO2 22 08/12/2018   BUN 6 08/12/2018   CREATININE 0.71 08/12/2018   GLUCOSE 113 (H) 08/12/2018    Discharge Medications:   Allergies as of 08/12/2018      Reactions   Percocet [oxycodone-acetaminophen] Itching, Nausea And Vomiting   Theraflu Max-d Cold & Flu [pseudoephedrine-dm-gg-apap] Nausea And Vomiting      Medication List    STOP taking these medications   ibuprofen 200 MG tablet Commonly known as:  ADVIL,MOTRIN     TAKE these medications   BC FAST PAIN RELIEF 650-195-33.3 MG Pack Generic drug:  Aspirin-Salicylamide-Caffeine Take 1 packet by mouth 2 (two) times daily as needed (FOR PAIN.). Notes to patient:  * Please do not  start until 08/16/18.     HYDROcodone-acetaminophen 5-325 MG tablet Commonly known as:  NORCO/VICODIN Take 1 tablet by mouth every 6 (six) hours as needed for up to 5 days. What changed:  reasons to take this Notes to patient:  If needed for longer than 5 days, call your doctor.       Diagnostic Studies: Dg Abd 1 View  Result Date: 08/12/2018 CLINICAL DATA:  Nephrolithiasis EXAM: ABDOMEN - 1 VIEW COMPARISON:  Intraoperative imaging 08/11/2018.  CT 08/03/2018. FINDINGS: Catheter projects over the right kidney. Small bore catheter projects along the course of the right ureter with the tip likely near the UVJ. No visible possible small bone fragments adjacent to  the proximal and distal aspects of the ureteral catheter. Nonobstructive bowel gas pattern. Left renal stone again noted. IMPRESSION: Possible small calcifications along the proximal and distal portions of the right ureteral catheter. These may reflect small retained ureteral stones. Electronically Signed   By: Rolm Baptise M.D.   On: 08/12/2018 11:40   Ct Abdomen Pelvis W Contrast  Result Date: 08/03/2018 CLINICAL DATA:  Diffuse abdominal pain for 1 day. EXAM: CT ABDOMEN AND PELVIS WITH CONTRAST TECHNIQUE: Multidetector CT imaging of the abdomen and pelvis was performed using the standard protocol following bolus administration of intravenous contrast. CONTRAST:  141mL ISOVUE-300 IOPAMIDOL (ISOVUE-300) INJECTION 61% COMPARISON:  None FINDINGS: Lower chest: No acute abnormality. Hepatobiliary: No focal liver abnormality is seen. No gallstones, gallbladder wall thickening, or biliary dilatation. Pancreas: Unremarkable. No pancreatic ductal dilatation or surrounding inflammatory changes. Spleen: Normal in size without focal abnormality. Adrenals/Urinary Tract: Normal appearance of the adrenal glands. Within the left mid kidney there is a calcification measuring 9 mm, image 52/4. Multiple lower pole right renal calculi are identified. The largest measures 6 mm, image 45/4. There is right-sided hydronephrosis and hydroureter. At the right UVJ there is a large stone which measures 1.7 cm, image 42/4. More distally there is a right mid ureteral calculus measuring 1.2 cm, image 39/4. These appear to have been present on plain film radiographs of the lumbar spine from 09/11/2015. Urinary bladder appears normal. Stomach/Bowel: Stomach appears normal. The small bowel loops have a normal course and caliber without obstruction. The appendix is visualized and appears normal. Unremarkable appearance of the colon. Vascular/Lymphatic: Normal appearance of the abdominal aorta. No enlarged retroperitoneal or mesenteric  adenopathy. No enlarged pelvic or inguinal lymph nodes. Reproductive: The uterus appears normal. Right ovarian dermoid is identified measuring 5.6 by 5.0 by 5.2 cm. Normal appearance of the left ovary. Other: Trace free fluid noted within the pelvis. Musculoskeletal: No acute or significant osseous findings. IMPRESSION: 1. Right-sided hydronephrosis and hydroureter noted. At the right UPJ there is a stone measuring 1.7 cm. More distally there is a right ureteral calculus measuring 1.2 cm. 2. Bilateral renal calculi. 3. 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   On: 08/03/2018 12:24   Nm Parathyroid W/spect  Result Date: 08/07/2018 CLINICAL DATA:  Hypercalcemia, kidney stones; PTH =662 EXAM: NM PARATHYROID SCINTIGRAPHY AND SPECT IMAGING TECHNIQUE: Following intravenous administration of radiopharmaceutical, early and 2-hour delayed planar images were obtained in the anterior projection. Delayed triplanar SPECT images were also obtained at 2 hours. RADIOPHARMACEUTICALS:  23.3 mCi Tc-77m Sestamibi IV COMPARISON:  None FINDINGS: Planar imaging: Initial images demonstrate symmetric uptake of tracer within the thyroid lobes. Additional large area of increased tracer localization is seen at the inferior aspect of the LEFT thyroid lobe extending caudally.  Delayed images demonstrate washout of tracer from the thyroid lobes with persistent abnormal increased tracer localization at the asymmetric area extending inferiorly at the base of the LEFT cervical region. SPECT imaging: Significant large nodular area of abnormal tracer retention identified at the inferior LEFT cervical region extending into the superior mediastinum consistent with a LEFT inferior parathyroid adenoma. No abnormal tracer retention at the sites of the remaining parathyroid glands. IMPRESSION: Abnormal exam demonstrating persistent abnormal sestamibi retention at the expected position of the LEFT inferior  parathyroid gland and extending into the LEFT superior mediastinum consistent with a large LEFT inferior parathyroid adenoma. Electronically Signed   By: Lavonia Dana M.D.   On: 08/07/2018 16:25   Dg Chest Port 1 View  Result Date: 08/11/2018 CLINICAL DATA:  Increasing shortness of breath and right-sided chest pressure. EXAM: PORTABLE CHEST 1 VIEW COMPARISON:  08/18/2016 FINDINGS: Shallow inspiration with linear atelectasis in the lung bases. Normal heart size and pulmonary vascularity. No airspace disease or consolidation. No blunting of costophrenic angles. No pneumothorax. Mediastinal contours appear intact. IMPRESSION: Shallow inspiration with linear atelectasis in the lung bases. Electronically Signed   By: Lucienne Capers M.D.   On: 08/11/2018 05:40   Dg C-arm 1-60 Min-no Report  Result Date: 08/11/2018 Fluoroscopy was utilized by the requesting physician.  No radiographic interpretation.   Ir Nephrostogram Right Thru Existing Access  Result Date: 08/14/2018 INDICATION: Status post right percutaneous nephrolithotomy on 08/11/2018 to remove large right ureteral calculi. EXAM: RIGHT NEPHROSTOGRAM VIA PRE-EXISTING NEPHROSTOMY TUBE COMPARISON:  None. MEDICATIONS: None ANESTHESIA/SEDATION: None CONTRAST:  20 mL Isovue-300-administered into the collecting system(s) FLUOROSCOPY TIME:  Fluoroscopy Time: 48 seconds.  6.8 mGy. COMPLICATIONS: None immediate. PROCEDURE: The large-bore [place] tip nephrostomy catheter was injected with contrast material under fluoroscopy. Fluoroscopic images were saved. The catheter was then reconnected to a gravity drainage bag. FINDINGS: The large bore [place] tip catheter tip lies in the renal pelvis. A 5 [place] safety catheter extends into the bladder. With injection of contrast, there is initial normal patency of the proximal ureter. There then is relative obstruction at the level of the mid ureter with very poor antegrade flow of contrast noted. There does not appear  to be significant stone fragments at the level of relative obstruction but there may be relative narrowing of the ureter as this is close to where the more distal calculus was present at the level of the upper sacrum. Due to poor drainage, the nephrostomy tube was left in place and attached to gravity bag drainage. IMPRESSION: Poor antegrade drainage of contrast with injection of the nephrostomy tube. There is relative obstruction of the ureter at the level of the upper sacrum which is felt to more likely be due to ureteral stricture rather than retained calculus fragment. This is at roughly the same level as the more distal of the 2 ureteral calculi removed. Electronically Signed   By: Aletta Edouard M.D.   On: 08/14/2018 11:37   Ir Ureteral Stent Right New Access W/o Sep Nephrostomy Cath  Result Date: 08/10/2018 INDICATION: History of obstructing right-sided ureteral stones. Please perform image guided nephrostomy catheter placement for impending percutaneous nephrolithotomy. Please obtain upper pole access if possible (urology request). EXAM: 1. ULTRASOUND-GUIDED FOR PUNCTURE OF THE RIGHT SIDED RENAL COLLECTING SYSTEM. 2. FLUOROSCOPIC GUIDED PLACEMENT OF A RIGHT SIDED NEPHROURETERAL CATHETER. COMPARISON:  CT of the abdomen and pelvis - 08/03/2018 MEDICATIONS: Ancef 2 gm IV; The antibiotic was administered in an appropriate time frame prior to skin puncture. ANESTHESIA/SEDATION:  Moderate (conscious) sedation was employed during this procedure. A total of Versed 8 mg and Fentanyl 300 mcg was administered intravenously. Moderate Sedation Time: 38 minutes. The patient's level of consciousness and vital signs were monitored continuously by radiology nursing throughout the procedure under my direct supervision. CONTRAST:  20 cc Isovue-300 - administered into the renal collecting system. FLUOROSCOPY TIME:  11 minutes, 42 seconds (161 mGy) COMPLICATIONS: None immediate. PROCEDURE: Informed written consent was  obtained from the patient after a discussion of the risks, benefits, and alternatives to treatment. The right flank region was prepped with Betadine in a sterile fashion, and a sterile drape was applied covering the operative field. A sterile gown and sterile gloves were used for the procedure. A timeout was performed prior to the initiation of the procedure. A pre procedural spot fluoroscopic image was obtained of the upper abdomen. Ultrasound scanning performed of the kidney demonstrated moderate to marked dilatation primarily involving the superior pole the right kidney as demonstrated on preceding abdominal CT. Under direct ultrasound guidance, a dilated posterosuperior calyx was targeted with a 22 gauge [place] needle after the overlying soft tissues were anesthetized with 1% lidocaine. Access to the collecting system was confirmed with the injection of a small amount of contrast. Next, the access needle was exchanged for a hydrophilic [place] set under fluoroscopic guidance. Contrast injection confirmed appropriate access. Next, utilizing a regular glidewire and ultimately roadrunner wire a 4 French angled glide catheter was advanced beyond both obstructing right-sided ureteral stones the level of the urinary bladder via the outer sheath from the [place] set. Contrast injection confirmed appropriate positioning. Next, over a Amplatz wire, the access system was removed and the 4 French angled glide catheter was re-advanced to the level of the urinary bladder. Contrast injection confirmed appropriate positioning. The catheter was secured at the skin entrance site with an interrupted suture. Postprocedural spot fluoroscopic radiograph was obtained. The catheter was capped and a dressing was placed. The patient tolerated the procedure well without immediate post procedural complication. FINDINGS: Pre procedural spot radiographic images demonstrates unchanged size and appearance of known obstructing stones within  the superior and mid/distal aspect of the ureter. Sonographic evaluation demonstrates moderate dilatation of the right renal collecting system as demonstrated on preceding abdominal CT. A dilated posterosuperior calyx was targeted via ultrasound guidance allowing fluoroscopic guided placement of a 4 French angled glide catheter past both ureteral stones with tip ultimately terminating with the urinary bladder. IMPRESSION: Successful ultrasound and fluoroscopic guided placement of a right sided 4 French angled glide catheter past both obstructing ureteral stones to the level of the urinary bladder to be utilized during impending nephrolithotomy procedure. Electronically Signed   By: Sandi Mariscal M.D.   On: 08/10/2018 14:05    Disposition: Home    Follow-up Information    Irine Seal, MD Follow up.   Specialty:  Urology Why:  on 08/25/18 at 10:45 AM.  Contact information: [place] [id number] [phone]        [facility] Follow up on 08/19/2018.   Specialty:  Family Medicine Why:   Appointment Wednesday 08/19/2018 at 3:30 PM. Please keep this appointment.   Contact information: [address] [place]           Signed: Irine Seal 08/14/2018, 12:53 PM

## 2018-08-17 ENCOUNTER — Other Ambulatory Visit: Payer: Self-pay | Admitting: Urology

## 2018-08-17 DIAGNOSIS — N135 Crossing vessel and stricture of ureter without hydronephrosis: Secondary | ICD-10-CM

## 2018-08-18 ENCOUNTER — Other Ambulatory Visit: Payer: Self-pay | Admitting: Radiology

## 2018-08-18 ENCOUNTER — Other Ambulatory Visit: Payer: Self-pay

## 2018-08-19 ENCOUNTER — Encounter (HOSPITAL_COMMUNITY): Payer: Self-pay

## 2018-08-19 ENCOUNTER — Ambulatory Visit: Payer: Self-pay | Admitting: Family Medicine

## 2018-08-19 ENCOUNTER — Other Ambulatory Visit: Payer: Self-pay

## 2018-08-19 ENCOUNTER — Ambulatory Visit (HOSPITAL_COMMUNITY)
Admission: RE | Admit: 2018-08-19 | Discharge: 2018-08-19 | Disposition: A | Discharge status: Home / Self Care | Payer: Self-pay | Source: Ambulatory Visit | Attending: Urology | Admitting: Urology

## 2018-08-19 DIAGNOSIS — N135 Crossing vessel and stricture of ureter without hydronephrosis: Secondary | ICD-10-CM | POA: Insufficient documentation

## 2018-08-19 DIAGNOSIS — Z87442 Personal history of urinary calculi: Secondary | ICD-10-CM | POA: Insufficient documentation

## 2018-08-19 DIAGNOSIS — Z87891 Personal history of nicotine dependence: Secondary | ICD-10-CM | POA: Insufficient documentation

## 2018-08-19 HISTORY — PX: IR URETERAL STENT PLACEMENT EXISTING ACCESS RIGHT: IMG6074

## 2018-08-19 LAB — BASIC METABOLIC PANEL
ANION GAP: 4 — AB (ref 5–15)
BUN: 6 mg/dL (ref 6–20)
CO2: 23 mmol/L (ref 22–32)
Calcium: 12 mg/dL — ABNORMAL HIGH (ref 8.9–10.3)
Chloride: 110 mmol/L (ref 98–111)
Creatinine, Ser: 0.6 mg/dL (ref 0.44–1.00)
GFR calc Af Amer: >60 mL/min (ref >60)
GFR calc non Af Amer: >60 mL/min (ref >60)
GLUCOSE: 92 mg/dL (ref 70–99)
Potassium: 4.2 mmol/L (ref 3.5–5.1)
Sodium: 137 mmol/L (ref 135–145)

## 2018-08-19 LAB — CBC WITH DIFFERENTIAL/PLATELET
Abs Immature Granulocytes: 0.04 10*3/uL (ref 0.00–0.07)
BASOS PCT: 0 %
Basophils Absolute: 0 10*3/uL (ref 0.0–0.1)
EOS ABS: 0.2 10*3/uL (ref 0.0–0.5)
Eosinophils Relative: 2 %
HCT: 40.8 % (ref 36.0–46.0)
Hemoglobin: 12.7 g/dL (ref 12.0–15.0)
IMMATURE GRANULOCYTES: 0 %
Lymphocytes Relative: 32 %
Lymphs Abs: 2.9 10*3/uL (ref 0.7–4.0)
MCH: 29.5 pg (ref 26.0–34.0)
MCHC: 31.1 g/dL (ref 30.0–36.0)
MCV: 94.7 fL (ref 80.0–100.0)
MONO ABS: 0.6 10*3/uL (ref 0.1–1.0)
MONOS PCT: 6 %
NEUTROS PCT: 60 %
Neutro Abs: 5.3 10*3/uL (ref 1.7–7.7)
PLATELETS: 363 10*3/uL (ref 150–400)
RBC: 4.31 MIL/uL (ref 3.87–5.11)
RDW: 12.7 % (ref 11.5–15.5)
WBC: 9 10*3/uL (ref 4.0–10.5)
nRBC: 0 % (ref 0.0–0.2)

## 2018-08-19 MED ORDER — MIDAZOLAM HCL 2 MG/2ML IJ SOLN
INTRAMUSCULAR | Status: AC
  Filled 2018-08-19: qty 8

## 2018-08-19 MED ORDER — SODIUM CHLORIDE 0.9 % IV SOLN
INTRAVENOUS | Status: DC
  Administered 2018-08-19: 12:00:00 via INTRAVENOUS

## 2018-08-19 MED ORDER — MIDAZOLAM HCL 2 MG/2ML IJ SOLN
INTRAMUSCULAR | Status: AC | PRN
  Administered 2018-08-19 (×3): 1 mg via INTRAVENOUS
  Administered 2018-08-19: 2 mg via INTRAVENOUS
  Administered 2018-08-19: 1 mg via INTRAVENOUS

## 2018-08-19 MED ORDER — CEFAZOLIN SODIUM-DEXTROSE 2-4 GM/100ML-% IV SOLN
INTRAVENOUS | Status: AC
  Administered 2018-08-19: 2 g via INTRAVENOUS
  Filled 2018-08-19: qty 100

## 2018-08-19 MED ORDER — FENTANYL CITRATE (PF) 100 MCG/2ML IJ SOLN
INTRAMUSCULAR | Status: AC
  Filled 2018-08-19: qty 6

## 2018-08-19 MED ORDER — IOPAMIDOL (ISOVUE-300) INJECTION 61%
INTRAVENOUS | Status: AC
  Administered 2018-08-19: 30 mL
  Filled 2018-08-19: qty 50

## 2018-08-19 MED ORDER — FENTANYL CITRATE (PF) 100 MCG/2ML IJ SOLN
INTRAMUSCULAR | Status: AC | PRN
  Administered 2018-08-19 (×4): 50 ug via INTRAVENOUS

## 2018-08-19 MED ORDER — LIDOCAINE HCL 1 % IJ SOLN
INTRAMUSCULAR | Status: AC
  Filled 2018-08-19: qty 20

## 2018-08-19 MED ORDER — IOPAMIDOL (ISOVUE-300) INJECTION 61%
50.0000 mL | Freq: Once | INTRAVENOUS | Status: AC | PRN
  Administered 2018-08-19: 30 mL

## 2018-08-19 MED ORDER — CEFAZOLIN SODIUM-DEXTROSE 2-4 GM/100ML-% IV SOLN
2.0000 g | INTRAVENOUS | Status: AC
  Administered 2018-08-19: 2 g via INTRAVENOUS

## 2018-08-19 NOTE — H&P (Signed)
Chief Complaint: Patient was seen in consultation today for right ureteral stent at the request of Wrenn,John  Referring Physician(s): Irine Seal  Supervising Physician: Aletta Edouard  Patient Status: [facility] - Out-pt  History of Present Illness: Tonya Brennan is a 32 y.o. female with right PCN placed on 11/18 for obstructing stones. She went to the OR on 11/19 for PCNL. PCN remained post procedure. She then had nephrostogram on 11/22 which showed residual stricture, but no evident stones. Decision was made to leave PCN in place for at least another week. She is now here today for repeat nephrostogram and hopefully internalization to ureteral stent. PMHx, meds, labs, imaging, allergies reviewed. Feels well, no recent fevers, chills, illness. Has been NPO today as directed.   History reviewed. No pertinent past medical history.  Past Surgical History:  Procedure Laterality Date  . IR NEPHROSTOGRAM RIGHT THRU EXISTING ACCESS  08/14/2018  . IR URETERAL STENT RIGHT NEW ACCESS W/O SEP NEPHROSTOMY CATH  08/10/2018  . NEPHROLITHOTOMY Right 08/11/2018   Procedure: NEPHROLITHOTOMY PERCUTANEOUS;  Surgeon: Irine Seal, MD;  Location: [facility] ORS;  Service: Urology;  Laterality: Right;    Allergies: Percocet [oxycodone-acetaminophen] and Theraflu max-d cold & flu [pseudoephedrine-[id number]]  Medications: Prior to Admission medications   Medication Sig Start Date End Date Taking? Authorizing Provider  HYDROcodone-acetaminophen (NORCO/VICODIN) 5-325 MG tablet Take 1 tablet by mouth every 6 (six) hours as needed. 08/17/18  Yes [provider]     History reviewed. No pertinent family history.  Social History   Socioeconomic History  . Marital status: Single    Spouse name: Not on file  . Number of children: Not on file  . Years of education: Not on file  . Highest education level: Not on file  Occupational History  . Not on file  Social Needs  . Financial resource  strain: Not on file  . Food insecurity:    Worry: Not on file    Inability: Not on file  . Transportation needs:    Medical: Not on file    Non-medical: Not on file  Tobacco Use  . Smoking status: Former Research scientist (life sciences)  . Smokeless tobacco: Never Used  Substance and Sexual Activity  . Alcohol use: Never    Frequency: Never  . Drug use: Yes    Types: Marijuana    Comment: daily use   . Sexual activity: Not on file  Lifestyle  . Physical activity:    Days per week: Not on file    Minutes per session: Not on file  . Stress: Not on file  Relationships  . Social connections:    Talks on phone: Not on file    Gets together: Not on file    Attends religious service: Not on file    Active member of club or organization: Not on file    Attends meetings of clubs or organizations: Not on file    Relationship status: Not on file  Other Topics Concern  . Not on file  Social History Narrative  . Not on file     Review of Systems: A 12 point ROS discussed and pertinent positives are indicated in the HPI above.  All other systems are negative.  Review of Systems  Vital Signs: BP (!) 143/96   Pulse 92   Temp 98.6 F (37 C) (Oral)   Resp 16   LMP 08/17/2018   SpO2 100%   Physical Exam  Constitutional: She is oriented to person, place, and time. She appears well-developed  and well-nourished. No distress.  HENT:  Head: Normocephalic.  Mouth/Throat: Oropharynx is clear and moist.  Cardiovascular: Normal rate, regular rhythm and normal heart sounds.  Pulmonary/Chest: Effort normal and breath sounds normal.  Abdominal: Soft.  (R)PCN intact, site clean, NT  Neurological: She is alert and oriented to person, place, and time.  Skin: Skin is warm and dry.    Imaging: Dg Abd 1 View  Result Date: 08/12/2018 CLINICAL DATA:  Nephrolithiasis EXAM: ABDOMEN - 1 VIEW COMPARISON:  Intraoperative imaging 08/11/2018.  CT 08/03/2018. FINDINGS: Catheter projects over the right kidney. Small bore  catheter projects along the course of the right ureter with the tip likely near the UVJ. No visible possible small bone fragments adjacent to the proximal and distal aspects of the ureteral catheter. Nonobstructive bowel gas pattern. Left renal stone again noted. IMPRESSION: Possible small calcifications along the proximal and distal portions of the right ureteral catheter. These may reflect small retained ureteral stones. Electronically Signed   By: Rolm Baptise M.D.   On: 08/12/2018 11:40   Ct Abdomen Pelvis W Contrast  Result Date: 08/03/2018 CLINICAL DATA:  Diffuse abdominal pain for 1 day. EXAM: CT ABDOMEN AND PELVIS WITH CONTRAST TECHNIQUE: Multidetector CT imaging of the abdomen and pelvis was performed using the standard protocol following bolus administration of intravenous contrast. CONTRAST:  152mL ISOVUE-300 IOPAMIDOL (ISOVUE-300) INJECTION 61% COMPARISON:  None FINDINGS: Lower chest: No acute abnormality. Hepatobiliary: No focal liver abnormality is seen. No gallstones, gallbladder wall thickening, or biliary dilatation. Pancreas: Unremarkable. No pancreatic ductal dilatation or surrounding inflammatory changes. Spleen: Normal in size without focal abnormality. Adrenals/Urinary Tract: Normal appearance of the adrenal glands. Within the left mid kidney there is a calcification measuring 9 mm, image 52/4. Multiple lower pole right renal calculi are identified. The largest measures 6 mm, image 45/4. There is right-sided hydronephrosis and hydroureter. At the right UVJ there is a large stone which measures 1.7 cm, image 42/4. More distally there is a right mid ureteral calculus measuring 1.2 cm, image 39/4. These appear to have been present on plain film radiographs of the lumbar spine from 09/11/2015. Urinary bladder appears normal. Stomach/Bowel: Stomach appears normal. The small bowel loops have a normal course and caliber without obstruction. The appendix is visualized and appears normal.  Unremarkable appearance of the colon. Vascular/Lymphatic: Normal appearance of the abdominal aorta. No enlarged retroperitoneal or mesenteric adenopathy. No enlarged pelvic or inguinal lymph nodes. Reproductive: The uterus appears normal. Right ovarian dermoid is identified measuring 5.6 by 5.0 by 5.2 cm. Normal appearance of the left ovary. Other: Trace free fluid noted within the pelvis. Musculoskeletal: No acute or significant osseous findings. IMPRESSION: 1. Right-sided hydronephrosis and hydroureter noted. At the right UPJ there is a stone measuring 1.7 cm. More distally there is a right ureteral calculus measuring 1.2 cm. 2. Bilateral renal calculi. 3. 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   On: 08/03/2018 12:24   Nm Parathyroid W/spect  Result Date: 08/07/2018 CLINICAL DATA:  Hypercalcemia, kidney stones; PTH =662 EXAM: NM PARATHYROID SCINTIGRAPHY AND SPECT IMAGING TECHNIQUE: Following intravenous administration of radiopharmaceutical, early and 2-hour delayed planar images were obtained in the anterior projection. Delayed triplanar SPECT images were also obtained at 2 hours. RADIOPHARMACEUTICALS:  23.3 mCi Tc-82m Sestamibi IV COMPARISON:  None FINDINGS: Planar imaging: Initial images demonstrate symmetric uptake of tracer within the thyroid lobes. Additional large area of increased tracer localization is seen at the inferior aspect of the  LEFT thyroid lobe extending caudally. Delayed images demonstrate washout of tracer from the thyroid lobes with persistent abnormal increased tracer localization at the asymmetric area extending inferiorly at the base of the LEFT cervical region. SPECT imaging: Significant large nodular area of abnormal tracer retention identified at the inferior LEFT cervical region extending into the superior mediastinum consistent with a LEFT inferior parathyroid adenoma. No abnormal tracer retention at the sites of the remaining  parathyroid glands. IMPRESSION: Abnormal exam demonstrating persistent abnormal sestamibi retention at the expected position of the LEFT inferior parathyroid gland and extending into the LEFT superior mediastinum consistent with a large LEFT inferior parathyroid adenoma. Electronically Signed   By: Lavonia Dana M.D.   On: 08/07/2018 16:25   Dg Chest Port 1 View  Result Date: 08/11/2018 CLINICAL DATA:  Increasing shortness of breath and right-sided chest pressure. EXAM: PORTABLE CHEST 1 VIEW COMPARISON:  08/18/2016 FINDINGS: Shallow inspiration with linear atelectasis in the lung bases. Normal heart size and pulmonary vascularity. No airspace disease or consolidation. No blunting of costophrenic angles. No pneumothorax. Mediastinal contours appear intact. IMPRESSION: Shallow inspiration with linear atelectasis in the lung bases. Electronically Signed   By: Lucienne Capers M.D.   On: 08/11/2018 05:40   Dg C-arm 1-60 Min-no Report  Result Date: 08/11/2018 Fluoroscopy was utilized by the requesting physician.  No radiographic interpretation.   Ir Nephrostogram Right Thru Existing Access  Result Date: 08/14/2018 INDICATION: Status post right percutaneous nephrolithotomy on 08/11/2018 to remove large right ureteral calculi. EXAM: RIGHT NEPHROSTOGRAM VIA PRE-EXISTING NEPHROSTOMY TUBE COMPARISON:  None. MEDICATIONS: None ANESTHESIA/SEDATION: None CONTRAST:  20 mL Isovue-300-administered into the collecting system(s) FLUOROSCOPY TIME:  Fluoroscopy Time: 48 seconds.  6.8 mGy. COMPLICATIONS: None immediate. PROCEDURE: The large-bore Councill tip nephrostomy catheter was injected with contrast material under fluoroscopy. Fluoroscopic images were saved. The catheter was then reconnected to a gravity drainage bag. FINDINGS: The large bore Councill tip catheter tip lies in the renal pelvis. A 5 [place] safety catheter extends into the bladder. With injection of contrast, there is initial normal patency of the  proximal ureter. There then is relative obstruction at the level of the mid ureter with very poor antegrade flow of contrast noted. There does not appear to be significant stone fragments at the level of relative obstruction but there may be relative narrowing of the ureter as this is close to where the more distal calculus was present at the level of the upper sacrum. Due to poor drainage, the nephrostomy tube was left in place and attached to gravity bag drainage. IMPRESSION: Poor antegrade drainage of contrast with injection of the nephrostomy tube. There is relative obstruction of the ureter at the level of the upper sacrum which is felt to more likely be due to ureteral stricture rather than retained calculus fragment. This is at roughly the same level as the more distal of the 2 ureteral calculi removed. Electronically Signed   By: Aletta Edouard M.D.   On: 08/14/2018 11:37   Ir Ureteral Stent Right New Access W/o Sep Nephrostomy Cath  Result Date: 08/10/2018 INDICATION: History of obstructing right-sided ureteral stones. Please perform image guided nephrostomy catheter placement for impending percutaneous nephrolithotomy. Please obtain upper pole access if possible (urology request). EXAM: 1. ULTRASOUND-GUIDED FOR PUNCTURE OF THE RIGHT SIDED RENAL COLLECTING SYSTEM. 2. FLUOROSCOPIC GUIDED PLACEMENT OF A RIGHT SIDED NEPHROURETERAL CATHETER. COMPARISON:  CT of the abdomen and pelvis - 08/03/2018 MEDICATIONS: Ancef 2 gm IV; The antibiotic was administered in an appropriate time frame  prior to skin puncture. ANESTHESIA/SEDATION: Moderate (conscious) sedation was employed during this procedure. A total of Versed 8 mg and Fentanyl 300 mcg was administered intravenously. Moderate Sedation Time: 38 minutes. The patient's level of consciousness and vital signs were monitored continuously by radiology nursing throughout the procedure under my direct supervision. CONTRAST:  20 cc Isovue-300 - administered into the  renal collecting system. FLUOROSCOPY TIME:  11 minutes, 42 seconds (875 mGy) COMPLICATIONS: None immediate. PROCEDURE: Informed written consent was obtained from the patient after a discussion of the risks, benefits, and alternatives to treatment. The right flank region was prepped with Betadine in a sterile fashion, and a sterile drape was applied covering the operative field. A sterile gown and sterile gloves were used for the procedure. A timeout was performed prior to the initiation of the procedure. A pre procedural spot fluoroscopic image was obtained of the upper abdomen. Ultrasound scanning performed of the kidney demonstrated moderate to marked dilatation primarily involving the superior pole the right kidney as demonstrated on preceding abdominal CT. Under direct ultrasound guidance, a dilated posterosuperior calyx was targeted with a 22 gauge Chiba needle after the overlying soft tissues were anesthetized with 1% lidocaine. Access to the collecting system was confirmed with the injection of a small amount of contrast. Next, the access needle was exchanged for a hydrophilic Accustick set under fluoroscopic guidance. Contrast injection confirmed appropriate access. Next, utilizing a regular glidewire and ultimately roadrunner wire a 4 French angled glide catheter was advanced beyond both obstructing right-sided ureteral stones the level of the urinary bladder via the outer sheath from the Ponder set. Contrast injection confirmed appropriate positioning. Next, over a Amplatz wire, the access system was removed and the 4 French angled glide catheter was re-advanced to the level of the urinary bladder. Contrast injection confirmed appropriate positioning. The catheter was secured at the skin entrance site with an interrupted suture. Postprocedural spot fluoroscopic radiograph was obtained. The catheter was capped and a dressing was placed. The patient tolerated the procedure well without immediate post  procedural complication. FINDINGS: Pre procedural spot radiographic images demonstrates unchanged size and appearance of known obstructing stones within the superior and mid/distal aspect of the ureter. Sonographic evaluation demonstrates moderate dilatation of the right renal collecting system as demonstrated on preceding abdominal CT. A dilated posterosuperior calyx was targeted via ultrasound guidance allowing fluoroscopic guided placement of a 4 French angled glide catheter past both ureteral stones with tip ultimately terminating with the urinary bladder. IMPRESSION: Successful ultrasound and fluoroscopic guided placement of a right sided 4 French angled glide catheter past both obstructing ureteral stones to the level of the urinary bladder to be utilized during impending nephrolithotomy procedure. Electronically Signed   By: Sandi Mariscal M.D.   On: 08/10/2018 14:05    Labs:  CBC: Recent Labs    08/03/18 1055 08/10/18 1053 08/11/18 1336 08/12/18 0437  WBC 9.2 8.8  --   --   HGB 14.1 15.0 13.4 13.9  HCT 45.0 47.5* 43.6 44.7  PLT 288 288  --   --     COAGS: Recent Labs    08/10/18 1053  INR 0.98    BMP: Recent Labs    08/03/18 1055 08/10/18 1053 08/12/18 0437  NA 139 138 136  K 3.8 4.1 4.4  CL 110 110 109  CO2 24 21* 22  GLUCOSE 80 90 113*  BUN 7 8 6   CALCIUM 11.6* 12.3* 12.3*  CREATININE 0.65 0.69 0.71  GFRNONAA >60 >60 >60  GFRAA >60 >60 >60    LIVER FUNCTION TESTS: Recent Labs    08/03/18 1055 08/12/18 0437  BILITOT 0.5 0.9  AST 17 12*  ALT 13 10  ALKPHOS 229* 189*  PROT 6.9 7.4  ALBUMIN 3.7 3.8    TUMOR MARKERS: No results for input(s): AFPTM, CEA, CA199, CHROMGRNA in the last 8760 hours.  Assessment and Plan: Hx of obstructing right ureteral stones. Plan for Nephrostogram and internalization to JJ ureteral stent, hopefully able to remove the PCN. Labs ok Risks and benefits were discussed with the patient including, but not limited to, infection,  bleeding, significant bleeding causing loss or decrease in renal function or damage to adjacent structures.   All of the patient's questions were answered, patient is agreeable to proceed.  Consent signed and in chart.    Thank you for this interesting consult.  I greatly enjoyed meeting Niana Martorana and look forward to participating in their care.  A copy of this report was sent to the requesting provider on this date.  Electronically Signed: Ascencion Dike, PA-C 08/19/2018, 12:34 PM   I spent a total of 20 minutes in face to face in clinical consultation, greater than 50% of which was counseling/coordinating care for nephrostogram/stent placement

## 2018-08-19 NOTE — Procedures (Signed)
Interventional Radiology Procedure Note  Procedure: Right ureteral stent placement  Complications: None  Estimated Blood Loss: None  Findings: 8 Fr, 22 cm right ureteral stent placed and extends from renal pelvis to bladder. Nephrostomy tube removed.  Venetia Night. Kathlene Cote, M.D Pager:  [phone]

## 2018-08-19 NOTE — Discharge Instructions (Addendum)
Ureteral Stent Implantation, Care After °Refer to this sheet in the next few weeks. These instructions provide you with information about caring for yourself after your procedure. Your health care provider may also give you more specific instructions. Your treatment has been planned according to current medical practices, but problems sometimes occur. Call your health care provider if you have any problems or questions after your procedure. °What can I expect after the procedure? °After the procedure, it is common to have: °· Nausea. °· Mild pain when you urinate. You may feel this pain in your lower back or lower abdomen. Pain should stop within a few minutes after you urinate. This may last for up to 1 week. °· A small amount of blood in your urine for several days. ° °Follow these instructions at home: ° °Medicines °· Take over-the-counter and prescription medicines only as told by your health care provider. °· If you were prescribed an antibiotic medicine, take it as told by your health care provider. Do not stop taking the antibiotic even if you start to feel better. °· Do not drive for 24 hours if you received a sedative. °· Do not drive or operate heavy machinery while taking prescription pain medicines. °Activity °· Return to your normal activities as told by your health care provider. Ask your health care provider what activities are safe for you. °· Do not lift anything that is heavier than 10 lb (4.5 kg). Follow this limit for 1 week after your procedure, or for as long as told by your health care provider. °General instructions °· Watch for any blood in your urine. Call your health care provider if the amount of blood in your urine increases. °· If you have a catheter: °? Follow instructions from your health care provider about taking care of your catheter and collection bag. °? Do not take baths, swim, or use a hot tub until your health care provider approves. °· Drink enough fluid to keep your urine  clear or pale yellow. °· Keep all follow-up visits as told by your health care provider. This is important. °Contact a health care provider if: °· You have pain that gets worse or does not get better with medicine, especially pain when you urinate. °· You have difficulty urinating. °· You feel nauseous or you vomit repeatedly during a period of more than 2 days after the procedure. °Get help right away if: °· Your urine is dark red or has blood clots in it. °· You are leaking urine (have incontinence). °· The end of the stent comes out of your urethra. °· You cannot urinate. °· You have sudden, sharp, or severe pain in your abdomen or lower back. °· You have a fever. °This information is not intended to replace advice given to you by your health care provider. Make sure you discuss any questions you have with your health care provider. °Document Released: 05/12/2013 Document Revised: 02/15/2016 Document Reviewed: 03/24/2015 °Elsevier Interactive Patient Education © 2018 [employer] ° °Moderate Conscious Sedation, Adult, Care After °These instructions provide you with information about caring for yourself after your procedure. Your health care provider may also give you more specific instructions. Your treatment has been planned according to current medical practices, but problems sometimes occur. Call your health care provider if you have any problems or questions after your procedure. °What can I expect after the procedure? °After your procedure, it is common: °· To feel sleepy for several hours. °· To feel clumsy and have poor balance   for several hours. °· To have poor judgment for several hours. °· To vomit if you eat too soon. ° °Follow these instructions at home: °For at least 24 hours after the procedure: ° °· Do not: °? Participate in activities where you could fall or become injured. °? Drive. °? Use heavy machinery. °? Drink alcohol. °? Take sleeping pills or medicines that cause drowsiness. °? Make  important decisions or sign legal documents. °? Take care of children on your own. °· Rest. °Eating and drinking °· Follow the diet recommended by your health care provider. °· If you vomit: °? Drink water, juice, or soup when you can drink without vomiting. °? Make sure you have little or no nausea before eating solid foods. °General instructions °· Have a responsible adult stay with you until you are awake and alert. °· Take over-the-counter and prescription medicines only as told by your health care provider. °· If you smoke, do not smoke without supervision. °· Keep all follow-up visits as told by your health care provider. This is important. °Contact a health care provider if: °· You keep feeling nauseous or you keep vomiting. °· You feel light-headed. °· You develop a rash. °· You have a fever. °Get help right away if: °· You have trouble breathing. °This information is not intended to replace advice given to you by your health care provider. Make sure you discuss any questions you have with your health care provider. °Document Released: 06/30/2013 Document Revised: 02/12/2016 Document Reviewed: 12/30/2015 °Elsevier Interactive Patient Education © 2018 [employer] ° °

## 2018-08-31 ENCOUNTER — Ambulatory Visit: Payer: Self-pay | Admitting: Family Medicine

## 2018-08-31 ENCOUNTER — Other Ambulatory Visit: Payer: Self-pay | Admitting: Urology

## 2018-08-31 DIAGNOSIS — N1330 Unspecified hydronephrosis: Secondary | ICD-10-CM

## 2018-09-09 ENCOUNTER — Ambulatory Visit (INDEPENDENT_AMBULATORY_CARE_PROVIDER_SITE_OTHER): Payer: Self-pay | Admitting: Family Medicine

## 2018-09-09 ENCOUNTER — Encounter: Payer: Self-pay | Admitting: Family Medicine

## 2018-09-09 VITALS — BP 132/83 | HR 93 | Temp 97.9°F | Resp 17 | Ht 65.0 in | Wt 178.4 lb

## 2018-09-09 DIAGNOSIS — E213 Hyperparathyroidism, unspecified: Secondary | ICD-10-CM

## 2018-09-09 DIAGNOSIS — B9689 Other specified bacterial agents as the cause of diseases classified elsewhere: Secondary | ICD-10-CM

## 2018-09-09 DIAGNOSIS — Z7689 Persons encountering health services in other specified circumstances: Secondary | ICD-10-CM

## 2018-09-09 DIAGNOSIS — D270 Benign neoplasm of right ovary: Secondary | ICD-10-CM

## 2018-09-09 DIAGNOSIS — N760 Acute vaginitis: Secondary | ICD-10-CM

## 2018-09-09 MED ORDER — METRONIDAZOLE 500 MG PO TABS: 500.0000 mg | ORAL_TABLET | Freq: Three times a day (TID) | ORAL | 0 refills | Status: DC

## 2018-09-09 NOTE — Patient Instructions (Addendum)
Thank you for choosing Primary Care at [facility] to be your medical home!    Tonya Brennan was seen by Molli Barrows, FNP today.   Leslie Dales primary care provider is Scot Jun, FNP.   For the best care possible, you should try to see Molli Barrows, FNP-C whenever you come to the clinic.   We look forward to seeing you again soon!  If you have any questions about your visit today, please call us at [phone] or feel free to reach your primary care provider via [place].       [place] for eye exam  Address: [address] [place], [place] [address]  Phone: [phone]     Bacterial Vaginosis  Bacterial vaginosis is an infection of the vagina. It happens when too many normal germs (healthy bacteria) grow in the vagina. This infection puts you at risk for infections from sex (STIs). Treating this infection can lower your risk for some STIs. You should also treat this if you are pregnant. It can cause your baby to be born early. Follow these instructions at home: Medicines  Take over-the-counter and prescription medicines only as told by your doctor.  Take or use your antibiotic medicine as told by your doctor. Do not stop taking or using it even if you start to feel better. General instructions  If you your sexual partner is a woman, tell her that you have this infection. She needs to get treatment if she has symptoms. If you have a female partner, he does not need to be treated.  During treatment: ? Avoid sex. ? Do not douche. ? Avoid alcohol as told. ? Avoid breastfeeding as told.  Drink enough fluid to keep your pee (urine) clear or pale yellow.  Keep your vagina and butt (rectum) clean. ? Wash the area with warm water every day. ? Wipe from front to back after you use the toilet.  Keep all follow-up visits as told by your doctor. This is important. Preventing this condition  Do not douche.  Use only warm water to wash around your  vagina.  Use protection when you have sex. This includes: ? Latex condoms. ? Dental dams.  Limit how many people you have sex with. It is best to only have sex with the same person (be monogamous).  Get tested for STIs. Have your partner get tested.  Wear underwear that is cotton or lined with cotton.  Avoid tight pants and pantyhose. This is most important in summer.  Do not use any products that have nicotine or tobacco in them. These include cigarettes and e-cigarettes. If you need help quitting, ask your doctor.  Do not use illegal drugs.  Limit how much alcohol you drink. Contact a doctor if:  Your symptoms do not get better, even after you are treated.  You have more discharge or pain when you pee (urinate).  You have a fever.  You have pain in your belly (abdomen).  You have pain with sex.  Your bleed from your vagina between periods. Summary  This infection happens when too many germs (bacteria) grow in the vagina.  Treating this condition can lower your risk for some infections from sex (STIs).  You should also treat this if you are pregnant. It can cause early (premature) birth.  Do not stop taking or using your antibiotic medicine even if you start to feel better. This information is not intended to replace advice given to you by your  health care provider. Make sure you discuss any questions you have with your health care provider. Document Released: 06/18/2008 Document Revised: 05/25/2016 Document Reviewed: 05/25/2016 Elsevier Interactive Patient Education  2019 [employer].

## 2018-09-09 NOTE — Progress Notes (Signed)
Tonya Brennan, is a 32 y.o. female  BTD:[id number]  [id number]:[id number]  DOB - Nov 07, 1985  CC:  Chief Complaint  Patient presents with  . Establish Care  . Hospitalization Follow-up    ED-Hosp 11/18-11/22: R kidney stone, nephrolithiasis. states that she isn't having any pain with urination, frequency, hematuria       HPI: Tonya Brennan is a 32 y.o. female is here today to establish care.   Tonya Brennan has Nephrolithiasis and Right kidney stone on their problem list.   Today's visit:  Patient is here to establish care and hospital follow-up. Recently hospitalized due to renal stones that were found incidentally on x-ray after undergoing treatment by a chiropractors for back pain. CT of abdominal pelvis noted the following on 08/03/2018:  IMPRESSION: 1. Right-sided hydronephrosis and hydroureter noted. At the right UPJ there is a stone measuring 1.7 cm. More distally there is a right ureteral calculus measuring 1.2 cm. 2. Bilateral renal calculi. 3. 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 .  She was also found to have an significantly elevated calcium level which in review of EMR has been a chronic on-going problem which has never been addressed. She was discharged from ER and referred emergently to [facility]. She underwent renal stent tube placement on 08/10/2018 during an impatient admission and discharged on 08/12/2018 with small stone fragments remaining. Renal sent was removed a few weeks ago and she denies dysuria, hematuria, or flank pain.  She is scheduled to follow-up with endocrinology for evaluation of excessive calcium production. She will likely undergo removal of parathyroid gland within the next month or so. She denies any muscle weakness, fatigue, or muscle spasm.   Current medications: Current Outpatient Medications:  .   metroNIDAZOLE (FLAGYL) 500 MG tablet, Take 1 tablet (500 mg total) by mouth 3 (three) times daily., Disp: 21 tablet, Rfl: 0   Pertinent family medical history: family history is not on file.   Allergies  Allergen Reactions  . Percocet [Oxycodone-Acetaminophen] Itching and Nausea And Vomiting  . Theraflu Max-D Cold & Flu [Pseudoephedrine-Dm-Gg-Apap] Nausea And Vomiting    Social History   Socioeconomic History  . Marital status: Single    Spouse name: Not on file  . Number of children: Not on file  . Years of education: Not on file  . Highest education level: Not on file  Occupational History  . Not on file  Social Needs  . Financial resource strain: Not on file  . Food insecurity:    Worry: Not on file    Inability: Not on file  . Transportation needs:    Medical: Not on file    Non-medical: Not on file  Tobacco Use  . Smoking status: Former Research scientist (life sciences)  . Smokeless tobacco: Never Used  Substance and Sexual Activity  . Alcohol use: Never    Frequency: Never  . Drug use: Yes    Types: Marijuana    Comment: daily use   . Sexual activity: Not on file  Lifestyle  . Physical activity:    Days per week: Not on file    Minutes per session: Not on file  . Stress: Not on file  Relationships  . Social connections:    Talks on phone: Not on file    Gets together: Not on file    Attends religious service: Not on file    Active member  of club or organization: Not on file    Attends meetings of clubs or organizations: Not on file    Relationship status: Not on file  . Intimate partner violence:    Fear of current or ex partner: Not on file    Emotionally abused: Not on file    Physically abused: Not on file    Forced sexual activity: Not on file  Other Topics Concern  . Not on file  Social History Narrative  . Not on file    Review of Systems: Constitutional: Negative for fever, chills, diaphoresis, activity change, appetite change and fatigue. Respiratory: Negative for  cough, choking, chest tightness, shortness of breath, wheezing and stridor.  Cardiovascular: Negative for chest pain, palpitations and leg swelling. Gastrointestinal: Negative for GI symptoms  Genitourinary: Negative for dysuria, urgency, frequency, hematuria, flank pain, decreased urine volume, difficulty urinating. Neurological: Negative for dizziness, tremors, seizures, syncope, facial asymmetry, speech difficulty, weakness, light-headedness, numbness and headaches.  Hematological: Negative for adenopathy. Does not bruise/bleed easily. Psychiatric/Behavioral: Negative for hallucinations, behavioral problems, confusion, dysphoric mood, decreased concentration and agitation.    Objective:   Vitals:   09/09/18 1357  BP: 132/83  Pulse: 93  Resp: 17  Temp: 97.9 F (36.6 C)    BP Readings from Last 3 Encounters:  09/09/18 132/83  08/19/18 121/86  08/12/18 131/89    Filed Weights   09/09/18 1357  Weight: 178 lb 6.4 oz (80.9 kg)      Physical Exam: Constitutional: Patient appears well-developed and well-nourished. No distress. HENT: Normocephalic, atraumatic, External right and left ear normal. Oropharynx is clear and moist.  Eyes: Conjunctivae and EOM are normal. PERRLA, no scleral icterus. Neck: Normal ROM. Neck supple. No JVD. No tracheal deviation. No thyromegaly. CVS: RRR, S1/S2 +, no murmurs, no gallops, no carotid bruit.  Pulmonary: Effort and breath sounds normal, no stridor, rhonchi, wheezes, rales.  Abdominal: Soft. BS +, no distension, tenderness, rebound or guarding.  Musculoskeletal: Normal range of motion. No edema and no tenderness.  Neuro: Alert. Normal muscle tone coordination. Normal gait. BUE and BLE strength 5/5. Bilateral hand grips symmetrical. Skin: Skin is warm and dry. No rash noted. Not diaphoretic. No erythema. No pallor. Psychiatric: Normal mood and affect. Behavior, judgment, thought content normal. Lab Results (prior encounters)  Lab Results   Component Value Date   WBC 9.0 08/19/2018   HGB 12.7 08/19/2018   HCT 40.8 08/19/2018   MCV 94.7 08/19/2018   PLT 363 08/19/2018   Lab Results  Component Value Date   CREATININE 0.60 08/19/2018   BUN 6 08/19/2018   NA 137 08/19/2018   K 4.2 08/19/2018   CL 110 08/19/2018   CO2 23 08/19/2018   Assessment and plan:  1. Encounter to establish care 2. Dermoid cyst of right ovary, incidental finding on recent CT of abdomen pelvic Cyst measuring 5.6 cm right ovary. Referral placed to gynecology for further evaluation and management - Ambulatory referral to Obstetrics / Gynecology 3. Bacterial vaginosis -noted on recent wet prep. Prescribing Flagyl 500 mg BID for a total of 7 days. 4. Hypercalcemia  -likely the cause of recent renal stones. Patient has an appointment with endocrinology for evaluation of possible Parathyroidectomy.     Meds ordered this encounter  Medications  . metroNIDAZOLE (FLAGYL) 500 MG tablet    Sig: Take 1 tablet (500 mg total) by mouth 3 (three) times daily.    Dispense:  21 tablet    Refill:  0   Financial packet  provided.  Patient will follow-up in 3 months to address any outstanding overdue health maintenance.  The patient was given clear instructions to go to ER or return to medical center if symptoms don't improve, worsen or new problems develop. The patient verbalized understanding. The patient was advised  to call and obtain lab results if they haven't heard anything from out office within 7-10 business days.  Molli Barrows, FNP Primary Care at [facility] [address] [place] [address] [phone]: [phone]    This note has been created with Dragon speech recognition software and Engineer, materials. Any transcriptional errors are unintentional.

## 2018-09-13 DIAGNOSIS — E213 Hyperparathyroidism, unspecified: Secondary | ICD-10-CM | POA: Insufficient documentation

## 2018-09-14 ENCOUNTER — Encounter: Payer: Self-pay | Admitting: Obstetrics & Gynecology

## 2018-09-24 ENCOUNTER — Encounter (HOSPITAL_COMMUNITY): Payer: Self-pay

## 2018-09-25 ENCOUNTER — Telehealth: Payer: Self-pay | Admitting: Family Medicine

## 2018-09-25 NOTE — Telephone Encounter (Signed)
Reviewed chart & saw documentation that states that patient had seen Endocrinology & was going to be following with them in regards to having her parathyroid removed.  Called patient to see if she had missed her appointment & we needed to put in a new referral. Patient hung up on me.

## 2018-09-25 NOTE — Telephone Encounter (Signed)
Patient called about thyroid referral

## 2018-09-29 ENCOUNTER — Encounter (HOSPITAL_COMMUNITY): Payer: Self-pay

## 2018-09-29 ENCOUNTER — Encounter (HOSPITAL_COMMUNITY): Admission: RE | Admit: 2018-09-29 | Payer: Self-pay | Source: Ambulatory Visit

## 2018-09-30 ENCOUNTER — Other Ambulatory Visit (HOSPITAL_COMMUNITY): Payer: Self-pay | Admitting: Surgery

## 2018-09-30 ENCOUNTER — Other Ambulatory Visit: Payer: Self-pay | Admitting: Surgery

## 2018-09-30 DIAGNOSIS — E210 Primary hyperparathyroidism: Secondary | ICD-10-CM

## 2018-10-02 ENCOUNTER — Other Ambulatory Visit (HOSPITAL_COMMUNITY): Payer: Self-pay | Admitting: Urology

## 2018-10-02 ENCOUNTER — Other Ambulatory Visit: Payer: Self-pay | Admitting: Urology

## 2018-10-02 DIAGNOSIS — N200 Calculus of kidney: Secondary | ICD-10-CM

## 2018-10-06 ENCOUNTER — Ambulatory Visit (HOSPITAL_COMMUNITY): Admission: RE | Admit: 2018-10-06 | Payer: Self-pay | Source: Ambulatory Visit

## 2018-10-06 ENCOUNTER — Encounter (HOSPITAL_COMMUNITY): Payer: Self-pay

## 2018-10-14 ENCOUNTER — Ambulatory Visit (HOSPITAL_COMMUNITY)
Admission: RE | Admit: 2018-10-14 | Discharge: 2018-10-14 | Disposition: A | Discharge status: Home / Self Care | Payer: Self-pay | Source: Ambulatory Visit | Attending: Surgery | Admitting: Surgery

## 2018-10-14 DIAGNOSIS — E210 Primary hyperparathyroidism: Secondary | ICD-10-CM | POA: Insufficient documentation

## 2018-10-29 ENCOUNTER — Encounter: Payer: Self-pay | Admitting: Obstetrics & Gynecology

## 2018-11-25 ENCOUNTER — Ambulatory Visit: Payer: Self-pay | Admitting: Obstetrics & Gynecology

## 2018-11-25 DIAGNOSIS — D270 Benign neoplasm of right ovary: Secondary | ICD-10-CM | POA: Insufficient documentation

## 2018-11-25 NOTE — Progress Notes (Signed)
Pt given info for Free Pap Screening

## 2018-11-25 NOTE — Progress Notes (Signed)
   Subjective:    Patient ID: Tonya Brennan, female    DOB: [birth year], 33 y.o.   MRN: [id number]  HPI 33 yo single P0 here today for referral after a 5.6 cm right dermoid cyst was found incidentally on a CT done when she had a kidney stone.    Review of Systems She has been having unprotected sex for 2 years. This partner has 5 children. Her periods are monthly.   She will be having a partial thyroidectomy in [place] in the near future. Objective:   Physical Exam Breathing, conversing, and ambulating normally Well nourished, well hydrated Black female, no apparent distress Abd- benign     Assessment & Plan:  5 cm right dermoid cyst- rec laparoscopy with dermoid/ovary removal I will send Jordan a message to schedule this surgery in about 3 months. Financial aid paperwork rec'd

## 2019-01-29 ENCOUNTER — Encounter: Payer: Self-pay | Admitting: Obstetrics & Gynecology

## 2019-03-03 ENCOUNTER — Telehealth: Payer: Self-pay | Admitting: Family Medicine

## 2019-03-03 NOTE — Telephone Encounter (Signed)
Left patient a detailed message about downloading the [employer] Meetings app to have her Virtual Visit. Patient was also left a message to call our office.

## 2019-03-05 ENCOUNTER — Ambulatory Visit: Payer: Self-pay | Admitting: Obstetrics & Gynecology

## 2019-03-05 ENCOUNTER — Other Ambulatory Visit: Payer: Self-pay

## 2019-03-05 NOTE — Progress Notes (Signed)
1110::Attempted to contact pt regarding her appointment.  Pt did not pick up.  Left message advising pt that she would be contacted regarding her appointment and informing the pt that she would be contacted again in 15 minutes.   1125::Attempted to contact pt regarding her appointment.  Pt did not pick up.  Left message advising pt that, as this was the 2nd attempt to contact her, she would need to call and reschedule.  Informed pt that she needed this appointment in order to have her surgery.

## 2019-03-08 NOTE — Progress Notes (Signed)
dnka

## 2019-03-10 ENCOUNTER — Inpatient Hospital Stay (HOSPITAL_COMMUNITY): Admission: RE | Admit: 2019-03-10 | Payer: Self-pay | Source: Ambulatory Visit

## 2019-03-12 ENCOUNTER — Other Ambulatory Visit (HOSPITAL_COMMUNITY): Payer: Self-pay

## 2019-03-16 ENCOUNTER — Ambulatory Visit (HOSPITAL_COMMUNITY): Admit: 2019-03-16 | Payer: MEDICAID | Admitting: Obstetrics & Gynecology

## 2019-03-16 ENCOUNTER — Encounter (HOSPITAL_COMMUNITY): Payer: Self-pay

## 2019-03-16 SURGERY — EXCISION, CYST, OVARY, LAPAROSCOPIC
Anesthesia: Choice | Laterality: Right

## 2019-06-01 ENCOUNTER — Emergency Department (HOSPITAL_BASED_OUTPATIENT_CLINIC_OR_DEPARTMENT_OTHER): Payer: Self-pay

## 2019-06-01 ENCOUNTER — Emergency Department (HOSPITAL_BASED_OUTPATIENT_CLINIC_OR_DEPARTMENT_OTHER)
Admission: EM | Admit: 2019-06-01 | Discharge: 2019-06-01 | Disposition: A | Discharge status: Home / Self Care | Payer: Self-pay | Attending: Emergency Medicine | Admitting: Emergency Medicine

## 2019-06-01 ENCOUNTER — Encounter (HOSPITAL_BASED_OUTPATIENT_CLINIC_OR_DEPARTMENT_OTHER): Payer: Self-pay | Admitting: *Deleted

## 2019-06-01 ENCOUNTER — Other Ambulatory Visit: Payer: Self-pay

## 2019-06-01 DIAGNOSIS — R109 Unspecified abdominal pain: Secondary | ICD-10-CM | POA: Insufficient documentation

## 2019-06-01 DIAGNOSIS — N23 Unspecified renal colic: Secondary | ICD-10-CM | POA: Insufficient documentation

## 2019-06-01 HISTORY — DX: Disorder of thyroid, unspecified: E07.9

## 2019-06-01 LAB — BASIC METABOLIC PANEL
Anion gap: 5 (ref 5–15)
BUN: 10 mg/dL (ref 6–20)
CO2: 21 mmol/L — ABNORMAL LOW (ref 22–32)
Calcium: 11.5 mg/dL — ABNORMAL HIGH (ref 8.9–10.3)
Chloride: 108 mmol/L (ref 98–111)
Creatinine, Ser: 0.71 mg/dL (ref 0.44–1.00)
GFR calc Af Amer: >60 mL/min (ref >60)
GFR calc non Af Amer: >60 mL/min (ref >60)
Glucose, Bld: 105 mg/dL — ABNORMAL HIGH (ref 70–99)
Potassium: 4 mmol/L (ref 3.5–5.1)
Sodium: 134 mmol/L — ABNORMAL LOW (ref 135–145)

## 2019-06-01 LAB — URINALYSIS, ROUTINE W REFLEX MICROSCOPIC
Bilirubin Urine: NEGATIVE
Glucose, UA: NEGATIVE mg/dL
Ketones, ur: NEGATIVE mg/dL
Nitrite: NEGATIVE
Protein, ur: NEGATIVE mg/dL
Specific Gravity, Urine: 1.025 (ref 1.005–1.030)
pH: 6 (ref 5.0–8.0)

## 2019-06-01 LAB — URINALYSIS, MICROSCOPIC (REFLEX)

## 2019-06-01 LAB — CBC WITH DIFFERENTIAL/PLATELET
Abs Immature Granulocytes: 0.04 10*3/uL (ref 0.00–0.07)
Basophils Absolute: 0 10*3/uL (ref 0.0–0.1)
Basophils Relative: 0 %
Eosinophils Absolute: 0.1 10*3/uL (ref 0.0–0.5)
Eosinophils Relative: 1 %
HCT: 44.4 % (ref 36.0–46.0)
Hemoglobin: 14.2 g/dL (ref 12.0–15.0)
Immature Granulocytes: 1 %
Lymphocytes Relative: 42 %
Lymphs Abs: 3.5 10*3/uL (ref 0.7–4.0)
MCH: 30.2 pg (ref 26.0–34.0)
MCHC: 32 g/dL (ref 30.0–36.0)
MCV: 94.5 fL (ref 80.0–100.0)
Monocytes Absolute: 0.4 10*3/uL (ref 0.1–1.0)
Monocytes Relative: 5 %
Neutro Abs: 4.3 10*3/uL (ref 1.7–7.7)
Neutrophils Relative %: 51 %
Platelets: 258 10*3/uL (ref 150–400)
RBC: 4.7 MIL/uL (ref 3.87–5.11)
RDW: 13.2 % (ref 11.5–15.5)
WBC: 8.3 10*3/uL (ref 4.0–10.5)
nRBC: 0 % (ref 0.0–0.2)

## 2019-06-01 LAB — PREGNANCY, URINE: Preg Test, Ur: NEGATIVE

## 2019-06-01 MED ORDER — KETOROLAC TROMETHAMINE 30 MG/ML IJ SOLN
15.0000 mg | Freq: Once | INTRAMUSCULAR | Status: AC
  Administered 2019-06-01: 14:00:00 15 mg via INTRAVENOUS
  Filled 2019-06-01: qty 1

## 2019-06-01 MED ORDER — HYDROCODONE-ACETAMINOPHEN 5-325 MG PO TABS: 1.0000 | ORAL_TABLET | Freq: Four times a day (QID) | ORAL | 0 refills | Status: DC | PRN

## 2019-06-01 MED ORDER — ONDANSETRON 4 MG PO TBDP: 4.0000 mg | ORAL_TABLET | Freq: Three times a day (TID) | ORAL | 0 refills | Status: DC | PRN

## 2019-06-01 MED ORDER — TAMSULOSIN HCL 0.4 MG PO CAPS: 0.4000 mg | ORAL_CAPSULE | Freq: Every day | ORAL | 0 refills | Status: DC

## 2019-06-01 NOTE — Discharge Instructions (Addendum)
°  Kidney Stone There is evidence of a kidney stone on the right side.  It appears as though it is on its way out.  Some kidney stones can take up to 30 days to pass. Hydration: Hydration is key to helping a kidney stone pass.  Have a goal of half a liter of water every hour or two. Antiinflammatory medications: Take 600 mg of ibuprofen every 6 hours or 440 mg (over the counter dose) to 500 mg (prescription dose) of naproxen every 12 hours for the next 3 days. After this time, these medications may be used as needed for pain. Take these medications with food to avoid upset stomach. Choose only one of these medications, do not take them together. Acetaminophen: Should you continue to have additional pain while taking the ibuprofen or naproxen, you may add in acetaminophen (generic for Tylenol) as needed. Your daily total maximum amount of acetaminophen from all sources should be limited to 4000mg /day for persons without liver problems, or 2000mg /day for those with liver problems. Percocet: May take Percocet (oxycodone-acetaminophen) as needed for severe pain.   Do not drive or perform other dangerous activities while taking this medication as it can cause drowsiness as well as changes in reaction time and judgement.  Please note that each pill of Percocet contains 325 mg of acetaminophen (generic for Tylenol) and the above dosage limits apply. Tamsulosin: This medication is designed to help the stone pass.  Take this medication daily until stone passes. Nausea/vomiting: Use the ondansetron (generic for Zofran) for nausea or vomiting.  This medication may not prevent all vomiting or nausea, but can help facilitate better hydration. Things that can help with nausea/vomiting also include peppermint/menthol candies, vitamin B12, and ginger. Follow-up: Follow-up with the urologist as soon as possible on this matter. Return: Return to the ED for significantly increased pain, difficulty urinating, pain with  urination, fever, uncontrolled vomiting, or any other major concerns.  For prescription assistance, may try using prescription discount sites or apps, such as goodrx.com  Please also be sure to follow-up with the endocrinologist, as previously advised. Your calcium is still elevated.

## 2019-06-01 NOTE — ED Triage Notes (Signed)
2 weeks of pain in her mid back with certain movements. States the other day she bent down and could not stand. No menses since July. All pregnancy test are negative.

## 2019-06-01 NOTE — ED Notes (Signed)
Patient transported to CT 

## 2019-06-01 NOTE — ED Provider Notes (Signed)
[facility] EMERGENCY DEPARTMENT Provider Note   CSN: [id number] Arrival date & time: 06/01/19  1147     History   Chief Complaint Chief Complaint  Patient presents with  . Back Pain  . Amenorrhea    HPI Tonya Brennan is a 33 y.o. female.     HPI   Tonya Brennan is a 33 y.o. female, with a history of kidney stone, hypercalcemia, and parathyroid mass, presenting to the ED with mid-back for the last week.  Her pain is bilateral, but worse on the right.  Pain is intermittently sore and sometimes stabbing, moderate to severe, waxing and waning, radiating across the back.  She states she has tried Motrin and Tylenol without relief. She states, "I bent over and had a lot of pain on the right side.  It then started on the left side.  I thought it was a pulled muscle, but I am afraid it may be another kidney stone because this is how my pain with my previous kidney stone started."  Patient also notes her last menstrual cycle was at the end of July.    Denies fever/chills, N/V/C/D, abdominal pain, syncope, dizziness, palpitations, chest pain, shortness of breath, cough, hematuria, dysuria, or any other complaints.  Past Medical History:  Diagnosis Date  . Kidney stones   . Thyroid disease     Patient Active Problem List   Diagnosis Date Noted  . Cyst, ovary, dermoid, right 11/25/2018  . Hyperparathyroidism ([place]) 09/13/2018  . Nephrolithiasis 08/10/2018  . Right kidney stone 08/10/2018    Past Surgical History:  Procedure Laterality Date  . IR NEPHROSTOGRAM RIGHT THRU EXISTING ACCESS  08/14/2018  . IR URETERAL STENT PLACEMENT EXISTING ACCESS RIGHT  08/19/2018  . IR URETERAL STENT RIGHT NEW ACCESS W/O SEP NEPHROSTOMY CATH  08/10/2018  . NEPHROLITHOTOMY Right 08/11/2018   Procedure: NEPHROLITHOTOMY PERCUTANEOUS;  Surgeon: Irine Seal, MD;  Location: [facility];  Service: Urology;  Laterality: Right;     OB History   No obstetric history on file.      Home Medications    Prior to Admission medications   Medication Sig Start Date End Date Taking? Authorizing Provider  HYDROcodone-acetaminophen (NORCO/VICODIN) 5-325 MG tablet Take 1 tablet by mouth every 6 (six) hours as needed for severe pain. 06/01/19   Ivon Oelkers C, PA-C  ondansetron (ZOFRAN ODT) 4 MG disintegrating tablet Take 1 tablet (4 mg total) by mouth every 8 (eight) hours as needed for nausea or vomiting. 06/01/19   Shya Kovatch C, PA-C  tamsulosin (FLOMAX) 0.4 MG CAPS capsule Take 1 capsule (0.4 mg total) by mouth daily. 06/01/19   Rishita Petron, Helane Gunther, PA-C    Family History No family history on file.  Social History Social History   Tobacco Use  . Smoking status: Former Research scientist (life sciences)  . Smokeless tobacco: Never Used  Substance Use Topics  . Alcohol use: Never    Frequency: Never  . Drug use: Yes    Types: Marijuana    Comment: daily use      Allergies   Percocet [oxycodone-acetaminophen] and Theraflu max-d cold & flu [pseudoephedrine-dm-gg-apap]   Review of Systems Review of Systems  Constitutional: Negative for chills, diaphoresis and fever.  Respiratory: Negative for cough and shortness of breath.   Cardiovascular: Negative for chest pain.  Gastrointestinal: Negative for abdominal pain, blood in stool, constipation, diarrhea, nausea and vomiting.  Genitourinary: Negative for decreased urine volume, difficulty urinating, dysuria, frequency, hematuria, vaginal discharge and vaginal pain.  Musculoskeletal: Positive  for back pain.  Neurological: Negative for syncope, weakness and numbness.  All other systems reviewed and are negative.    Physical Exam Updated Vital Signs BP (!) 132/99 (BP Location: Left Arm)   Pulse 89   Temp 98 F (36.7 C) (Oral)   Resp 18   Ht 5\' 6"  (1.676 m)   Wt 76.4 kg   SpO2 100%   BMI 27.18 kg/m   Physical Exam Vitals signs and nursing note reviewed.  Constitutional:      General: She is not in acute distress.    Appearance: She is well-developed. She is not  diaphoretic.  HENT:     Head: Normocephalic and atraumatic.     Mouth/Throat:     Mouth: Mucous membranes are moist.     Pharynx: Oropharynx is clear.  Eyes:     Conjunctiva/sclera: Conjunctivae normal.  Neck:     Musculoskeletal: Neck supple.  Cardiovascular:     Rate and Rhythm: Normal rate and regular rhythm.     Pulses: Normal pulses.          Radial pulses are 2+ on the right side and 2+ on the left side.       Posterior tibial pulses are 2+ on the right side and 2+ on the left side.     Heart sounds: Normal heart sounds.     Comments: Tactile temperature in the extremities appropriate and equal bilaterally. Pulmonary:     Effort: Pulmonary effort is normal. No respiratory distress.     Breath sounds: Normal breath sounds.  Abdominal:     Palpations: Abdomen is soft.     Tenderness: There is no abdominal tenderness. There is no guarding.  Musculoskeletal:       Back:     Right lower leg: No edema.     Left lower leg: No edema.     Comments: Some tenderness even with light touch throughout the bilateral thoracic musculature.  Increased pain with movement.  Normal motor function intact in all extremities. No midline spinal tenderness.   Lymphadenopathy:     Cervical: No cervical adenopathy.  Skin:    General: Skin is warm and dry.  Neurological:     Mental Status: She is alert.     Comments: Sensation grossly intact to light touch in the lower extremities bilaterally. No saddle anesthesias. Strength 5/5 in the bilateral lower extremities. No noted gait deficit. Coordination intact with heel to shin testing. No hypo/hyper reflexive.  Psychiatric:        Mood and Affect: Mood and affect normal.        Speech: Speech normal.        Behavior: Behavior normal.      ED Treatments / Results  Labs (all labs ordered are listed, but only abnormal results are displayed) Labs Reviewed  URINALYSIS, ROUTINE W REFLEX MICROSCOPIC - Abnormal; Notable for the following  components:      Result Value   APPearance CLOUDY (*)    Hgb urine dipstick TRACE (*)    Leukocytes,Ua SMALL (*)    All other components within normal limits  URINALYSIS, MICROSCOPIC (REFLEX) - Abnormal; Notable for the following components:   Bacteria, UA MANY (*)    All other components within normal limits  BASIC METABOLIC PANEL - Abnormal; Notable for the following components:   Sodium 134 (*)    CO2 21 (*)    Glucose, Bld 105 (*)    Calcium 11.5 (*)    All other components  within normal limits  PREGNANCY, URINE  CBC WITH DIFFERENTIAL/PLATELET    EKG EKG Interpretation  Date/Time:  Tuesday June 01 2019 12:23:27 EDT Ventricular Rate:  71 PR Interval:    QRS Duration: 78 QT Interval:  349 QTC Calculation: 380 R Axis:   37 Text Interpretation:  Sinus rhythm Normal ECG Confirmed by Blanchie Dessert [phone]) on 06/01/2019 3:19:41 PM   Radiology Dg Chest 2 View  Result Date: 06/01/2019 CLINICAL DATA:  Chest and upper back pain EXAM: CHEST - 2 VIEW COMPARISON:  08/11/2018 FINDINGS: The heart size and mediastinal contours are within normal limits. Both lungs are clear. The visualized skeletal structures are unremarkable. IMPRESSION: No acute abnormality of the lungs. Electronically Signed   By: Eddie Candle M.D.   On: 06/01/2019 13:10   Ct Renal Stone Study  Result Date: 06/01/2019 CLINICAL DATA:  Two week history of mid back pain. History of renal calculi. EXAM: CT ABDOMEN AND PELVIS WITHOUT CONTRAST TECHNIQUE: Multidetector CT imaging of the abdomen and pelvis was performed following the standard protocol without IV contrast. COMPARISON:  CT scan 08/03/2018 FINDINGS: Lower chest: The lung bases are clear of an acute process. No worrisome pulmonary lesions or pleural effusions. The heart is normal in size. No pericardial effusion. Hepatobiliary: No focal hepatic lesions or intrahepatic biliary dilatation. The gallbladder is unremarkable. No common bile duct dilatation. Pancreas:  No mass, inflammation or ductal dilatation. Spleen: Normal size.  No focal lesions. Adrenals/Urinary Tract: Adrenal glands are unremarkable and stable. Left kidney: There is a 9 mm midpole calculus which appears relatively stable. No left-sided obstructing ureteral calculi. No left-sided hydroureteronephrosis. The right kidney is small and demonstrates scarring changes. There are several small renal calculi. There is a 4 mm calculus in the upper right ureter near the UPJ. This measured 15 mm on the prior CT scan. There is mild right-sided hydronephrosis. No distal right ureteral calculi. Bladder: No bladder mass or bladder calculi. Stomach/Bowel: The stomach, duodenum, small bowel and colon are grossly normal without oral contrast. No acute inflammatory changes, mass lesions or obstructive findings. The terminal ileum and appendix are normal. Vascular/Lymphatic: The aorta is normal in caliber. Minimal atheroscerlotic calcifications. No mesenteric of retroperitoneal mass or adenopathy. Small scattered lymph nodes are noted. Reproductive: The uterus is unremarkable. The left ovary is normal and stable. Stable right ovarian dermoid measuring 5.6 x 5.1 cm and containing fat, some fluid and calcification. Other: No pelvic lymphadenopathy or free pelvic fluid collections. No inguinal mass or adenopathy. Musculoskeletal: No significant bony findings. Limbus vertebrae noted at L4 and L5. IMPRESSION: 1. Bilateral renal calculi as detailed above. 2. 4 mm calculus in the upper right ureter near the UPJ with mild hydronephrosis. 3. No worrisome renal or bladder lesions are identified without contrast. 4. Stable small scarred right kidney. 5. Stable appearing right ovarian dermoid measuring 5.6 x 5.1 cm. Electronically Signed   By: Marijo Sanes M.D.   On: 06/01/2019 14:04    Procedures Procedures (including critical care time)  Medications Ordered in ED Medications  ketorolac (TORADOL) 30 MG/ML injection 15 mg (15 mg  Intravenous Given 06/01/19 1335)     Initial Impression / Assessment and Plan / ED Course  I have reviewed the triage vital signs and the nursing notes.  Pertinent labs & imaging results that were available during my care of the patient were reviewed by me and considered in my medical decision making (see chart for details).  Clinical Course as of Jun 01 1531  Tue  Jun 01, 2019  1422 Previously in the 12-13 range.  Calcium(!): 11.5 [SJ]  1422 Patient denies any dysuria, suprapubic pain, other abdominal pain, fever, etc. to suggest urinary infection.  Urinalysis, Microscopic (reflex)(!) [SJ]  1430 Patient states she feels much better.  Pain is well controlled.  Denies additional symptoms.   [SJ]    Clinical Course User Index [SJ] Trent Gabler C, PA-C       Patient presents with back pain, worse on the right.  States it is similar to ureteral colic she experienced last year. Patient is nontoxic appearing, afebrile, not tachycardic, not tachypneic, not hypotensive, maintains excellent SPO2 on room air, and is in no apparent distress.  CT shows 4 mm proximal right ureteral stone.  Urology follow-up.  Pain well controlled here in the ED. Patient's hypercalcemia is noted, improved from previous, but is discussed in detail below. The patient was given instructions for home care as well as return precautions. Patient voices understanding of these instructions, accepts the plan, and is comfortable with discharge.  Findings and plan of care discussed with Blanchie Dessert, MD.   Note: Order for chest x-ray was placed through nursing protocol prior to my contact with the patient.  Chart review shows patient was diagnosed with hyperparathyroidism causing her hypercalcemia.  She underwent a parathyroid nuclear medicine study November 2019 which showed nodule consistent with parathyroid adenoma.  Thyroid ultrasound in January 2020 showed 4.2 cm complex mass inferior to the left thyroid lobe. She  states she was supposed to follow up on these to have thyroidectomy and underwent an initial consultation with endocrinology, though she cannot remember with whom she had this consultation.  She does state she was going to set up her surgery, but did not do so stating, "first it was coronavirus and then I had some stuff I needed to take care of." Patient states she does have plans to again follow-up with endocrinology on this matter.  The importance of this follow-up was stressed with the patient.  Final Clinical Impressions(s) / ED Diagnoses   Final diagnoses:  Renal colic on right side    ED Discharge Orders         Ordered    HYDROcodone-acetaminophen (NORCO/VICODIN) 5-325 MG tablet  Every 6 hours PRN     06/01/19 1452    ondansetron (ZOFRAN ODT) 4 MG disintegrating tablet  Every 8 hours PRN     06/01/19 1452    tamsulosin (FLOMAX) 0.4 MG CAPS capsule  Daily     06/01/19 1452           Lorayne Bender, PA-C 06/01/19 1536    Blanchie Dessert, MD 06/02/19 [phone]

## 2019-06-01 NOTE — ED Notes (Signed)
Iphone taken to med room to charge

## 2019-06-01 NOTE — ED Notes (Signed)
Cell phone given back to pt

## 2019-06-01 NOTE — ED Notes (Signed)
Pt. Given PO fluids

## 2019-06-01 NOTE — ED Notes (Signed)
Pt on monitor 

## 2019-06-01 NOTE — ED Notes (Signed)
Patient transported to X-ray 

## 2019-06-03 ENCOUNTER — Encounter (HOSPITAL_COMMUNITY): Payer: Self-pay | Admitting: *Deleted

## 2019-06-03 ENCOUNTER — Ambulatory Visit (HOSPITAL_COMMUNITY): Payer: Self-pay

## 2019-06-03 ENCOUNTER — Ambulatory Visit (HOSPITAL_COMMUNITY)
Admission: RE | Admit: 2019-06-03 | Discharge: 2019-06-03 | Disposition: A | Discharge status: Home / Self Care | Payer: Self-pay | Attending: Urology | Admitting: Urology

## 2019-06-03 ENCOUNTER — Ambulatory Visit (HOSPITAL_COMMUNITY): Payer: Self-pay | Admitting: Certified Registered Nurse Anesthetist

## 2019-06-03 ENCOUNTER — Other Ambulatory Visit: Payer: Self-pay | Admitting: Urology

## 2019-06-03 ENCOUNTER — Encounter (HOSPITAL_COMMUNITY)
Admission: RE | Disposition: A | Discharge status: Home / Self Care | Payer: Self-pay | Source: Home / Self Care | Attending: Urology

## 2019-06-03 DIAGNOSIS — Z20828 Contact with and (suspected) exposure to other viral communicable diseases: Secondary | ICD-10-CM | POA: Insufficient documentation

## 2019-06-03 DIAGNOSIS — N200 Calculus of kidney: Secondary | ICD-10-CM

## 2019-06-03 DIAGNOSIS — Z87891 Personal history of nicotine dependence: Secondary | ICD-10-CM | POA: Insufficient documentation

## 2019-06-03 DIAGNOSIS — Z885 Allergy status to narcotic agent status: Secondary | ICD-10-CM | POA: Insufficient documentation

## 2019-06-03 DIAGNOSIS — N132 Hydronephrosis with renal and ureteral calculous obstruction: Secondary | ICD-10-CM | POA: Insufficient documentation

## 2019-06-03 DIAGNOSIS — Z87442 Personal history of urinary calculi: Secondary | ICD-10-CM | POA: Insufficient documentation

## 2019-06-03 HISTORY — PX: CYSTOSCOPY W/ URETERAL STENT PLACEMENT: SHX1429

## 2019-06-03 HISTORY — DX: Personal history of urinary calculi: Z87.442

## 2019-06-03 LAB — SARS CORONAVIRUS 2 BY RT PCR (HOSPITAL ORDER, PERFORMED IN ~~LOC~~ HOSPITAL LAB): SARS Coronavirus 2: NEGATIVE

## 2019-06-03 SURGERY — CYSTOSCOPY, WITH RETROGRADE PYELOGRAM AND URETERAL STENT INSERTION
Anesthesia: General | Laterality: Right

## 2019-06-03 MED ORDER — LIDOCAINE 2% (20 MG/ML) 5 ML SYRINGE
INTRAMUSCULAR | Status: DC | PRN
  Administered 2019-06-03: 80 mg via INTRAVENOUS

## 2019-06-03 MED ORDER — MIDAZOLAM HCL 2 MG/2ML IJ SOLN
INTRAMUSCULAR | Status: DC | PRN
  Administered 2019-06-03: 2 mg via INTRAVENOUS

## 2019-06-03 MED ORDER — PROMETHAZINE HCL 25 MG/ML IJ SOLN: 6.2500 mg | INTRAMUSCULAR | Status: DC | PRN

## 2019-06-03 MED ORDER — HYDROMORPHONE HCL 1 MG/ML IJ SOLN
1.0000 mg | INTRAMUSCULAR | Status: AC | PRN
  Administered 2019-06-03: 1 mg via INTRAVENOUS
  Filled 2019-06-03: qty 1

## 2019-06-03 MED ORDER — CEFAZOLIN SODIUM-DEXTROSE 2-4 GM/100ML-% IV SOLN
2.0000 g | INTRAVENOUS | Status: AC
  Administered 2019-06-03: 20:00:00 2 g via INTRAVENOUS
  Filled 2019-06-03: qty 100

## 2019-06-03 MED ORDER — FENTANYL CITRATE (PF) 100 MCG/2ML IJ SOLN
INTRAMUSCULAR | Status: AC
  Filled 2019-06-03: qty 2

## 2019-06-03 MED ORDER — ONDANSETRON HCL 4 MG/2ML IJ SOLN
INTRAMUSCULAR | Status: AC
  Filled 2019-06-03: qty 2

## 2019-06-03 MED ORDER — DEXAMETHASONE SODIUM PHOSPHATE 10 MG/ML IJ SOLN
INTRAMUSCULAR | Status: DC | PRN
  Administered 2019-06-03: 10 mg via INTRAVENOUS

## 2019-06-03 MED ORDER — DEXAMETHASONE SODIUM PHOSPHATE 10 MG/ML IJ SOLN
INTRAMUSCULAR | Status: AC
  Filled 2019-06-03: qty 1

## 2019-06-03 MED ORDER — FENTANYL CITRATE (PF) 100 MCG/2ML IJ SOLN: 25.0000 ug | INTRAMUSCULAR | Status: DC | PRN

## 2019-06-03 MED ORDER — MIDAZOLAM HCL 2 MG/2ML IJ SOLN
INTRAMUSCULAR | Status: AC
  Filled 2019-06-03: qty 2

## 2019-06-03 MED ORDER — HYDROCODONE-ACETAMINOPHEN 5-325 MG PO TABS: 1.0000 | ORAL_TABLET | ORAL | 0 refills | Status: DC | PRN

## 2019-06-03 MED ORDER — PROPOFOL 10 MG/ML IV BOLUS
INTRAVENOUS | Status: AC
  Filled 2019-06-03: qty 20

## 2019-06-03 MED ORDER — LIDOCAINE 2% (20 MG/ML) 5 ML SYRINGE
INTRAMUSCULAR | Status: AC
  Filled 2019-06-03: qty 5

## 2019-06-03 MED ORDER — SODIUM CHLORIDE 0.9 % IR SOLN
Status: DC | PRN
  Administered 2019-06-03: 3000 mL via INTRAVESICAL

## 2019-06-03 MED ORDER — ONDANSETRON HCL 4 MG/2ML IJ SOLN
INTRAMUSCULAR | Status: DC | PRN
  Administered 2019-06-03: 4 mg via INTRAVENOUS

## 2019-06-03 MED ORDER — FENTANYL CITRATE (PF) 100 MCG/2ML IJ SOLN
INTRAMUSCULAR | Status: DC | PRN
  Administered 2019-06-03: 50 ug via INTRAVENOUS

## 2019-06-03 MED ORDER — LACTATED RINGERS IV SOLN
INTRAVENOUS | Status: DC
  Administered 2019-06-03: 19:00:00 via INTRAVENOUS

## 2019-06-03 MED ORDER — IOHEXOL 300 MG/ML  SOLN
INTRAMUSCULAR | Status: DC | PRN
  Administered 2019-06-03: 21:00:00 10 mL via URETHRAL

## 2019-06-03 MED ORDER — PROPOFOL 10 MG/ML IV BOLUS
INTRAVENOUS | Status: DC | PRN
  Administered 2019-06-03: 180 mg via INTRAVENOUS

## 2019-06-03 SURGICAL SUPPLY — 23 items
BAG URO CATCHER STRL LF (MISCELLANEOUS) ×3 IMPLANT
BASKET ZERO TIP NITINOL 2.4FR (BASKET) ×3 IMPLANT
CATH INTERMIT  6FR 70CM (CATHETERS) ×3 IMPLANT
CLOTH BEACON ORANGE TIMEOUT ST (SAFETY) ×3 IMPLANT
COVER WAND RF STERILE (DRAPES) IMPLANT
EXTRACTOR STONE NITINOL NGAGE (UROLOGICAL SUPPLIES) ×3 IMPLANT
FIBER LASER TRAC TIP (UROLOGICAL SUPPLIES) IMPLANT
GLOVE BIO SURGEON STRL SZ8 (GLOVE) ×15 IMPLANT
GOWN STRL REUS W/TWL XL LVL3 (GOWN DISPOSABLE) ×6 IMPLANT
GUIDEWIRE ANG ZIPWIRE 038X150 (WIRE) IMPLANT
GUIDEWIRE STR DUAL SENSOR (WIRE) IMPLANT
IV NS 1000ML (IV SOLUTION)
IV NS 1000ML BAXH (IV SOLUTION) IMPLANT
KIT TURNOVER KIT A (KITS) ×3 IMPLANT
MANIFOLD NEPTUNE II (INSTRUMENTS) ×3 IMPLANT
PACK CYSTO (CUSTOM PROCEDURE TRAY) ×3 IMPLANT
SHEATH URETERAL 12FRX35CM (MISCELLANEOUS) IMPLANT
STENT CONTOUR 6FRX26X.038 (STENTS) ×3 IMPLANT
STENT URET 6FRX26 CONTOUR (STENTS) IMPLANT
TUBE FEEDING 8FR 16IN STR KANG (MISCELLANEOUS) IMPLANT
TUBING CONNECTING 10 (TUBING) ×2 IMPLANT
TUBING CONNECTING 10' (TUBING) ×1
TUBING UROLOGY SET (TUBING) IMPLANT

## 2019-06-03 NOTE — H&P (Signed)
Urology Admission H&P  Chief Complaint: right flank pain  History of Present Illness: Ms Tonya Brennan is a 33yo with a history of nephrolithiasis who has been having intermittent right flank pain. She underwent CT which showed a 40mm right proximal ureteral calculus with mild hydronephrosis. The pain is intermittent, sharp, moderate to severe and nonraditing. The pain has been present for over 1 week.The pain is not alleviated with narcotic medication. She has associated nausea but no vomiting. No worsening LUTS. No gross hematuria. No other associated symptoms. No exacerbating/alleviaitng events. No fevers/chills/sweats.  Past Medical History:  Diagnosis Date  . History of kidney stones   . Kidney stones   . Thyroid disease    Past Surgical History:  Procedure Laterality Date  . IR NEPHROSTOGRAM RIGHT THRU EXISTING ACCESS  08/14/2018  . IR URETERAL STENT PLACEMENT EXISTING ACCESS RIGHT  08/19/2018  . IR URETERAL STENT RIGHT NEW ACCESS W/O SEP NEPHROSTOMY CATH  08/10/2018  . NEPHROLITHOTOMY Right 08/11/2018   Procedure: NEPHROLITHOTOMY PERCUTANEOUS;  Surgeon: Irine Seal, MD;  Location: [facility];  Service: Urology;  Laterality: Right;    Home Medications:  Current Facility-Administered Medications  Medication Dose Route Frequency Provider Last Rate Last Dose  . ceFAZolin (ANCEF) IVPB 2g/100 mL premix  2 g Intravenous 30 min Pre-Op Severa Jeremiah, Candee Furbish, MD      . lactated ringers infusion   Intravenous Continuous Audry Pili, MD 75 mL/hr at 06/03/19 1841     Allergies:  Allergies  Allergen Reactions  . Percocet [Oxycodone-Acetaminophen] Itching and Nausea And Vomiting  . Theraflu Max-D Cold & Flu [Pseudoephedrine-Dm-Gg-Apap] Nausea And Vomiting    History reviewed. No pertinent family history. Social History:  reports that she has quit smoking. She has never used smokeless tobacco. She reports current drug use. Drug: Marijuana. She reports that she does not drink alcohol.  Review of  Systems  Gastrointestinal: Positive for nausea.  Genitourinary: Positive for flank pain.  All other systems reviewed and are negative.   Physical Exam:  Vital signs in last 24 hours: Temp:  [98.8 F (37.1 C)] 98.8 F (37.1 C) (09/10 1700) Pulse Rate:  [55-73] 73 (09/10 1855) Resp:  [16] 16 (09/10 1700) BP: (139-159)/(105-110) 142/109 (09/10 1855) SpO2:  [100 %] 100 % (09/10 1855) Weight:  [74.8 kg] 74.8 kg (09/10 1700) Physical Exam  Constitutional: She is oriented to person, place, and time. She appears well-developed and well-nourished.  HENT:  Head: Normocephalic and atraumatic.  Eyes: Pupils are equal, round, and reactive to light. EOM are normal.  Neck: Normal range of motion. No thyromegaly present.  Cardiovascular: Normal rate and regular rhythm.  Respiratory: Effort normal. No respiratory distress.  GI: Soft. She exhibits no distension.  Musculoskeletal: Normal range of motion.        General: No edema.  Neurological: She is oriented to person, place, and time.  Skin: Skin is warm and dry.  Psychiatric: She has a normal mood and affect. Her behavior is normal. Judgment and thought content normal.    Laboratory Data:  Results for orders placed or performed during the hospital encounter of 06/03/19 (from the past 24 hour(s))  SARS Coronavirus 2 [facility] order, Performed in [facility] hospital lab) Nasopharyngeal Nasopharyngeal Swab     Status: None   Collection Time: 06/03/19  5:21 PM   Specimen: Nasopharyngeal Swab  Result Value Ref Range   SARS Coronavirus 2 NEGATIVE NEGATIVE   Recent Results (from the past 240 hour(s))  SARS Coronavirus 2 [facility] order, Performed  in [facility] lab) Nasopharyngeal Nasopharyngeal Swab     Status: None   Collection Time: 06/03/19  5:21 PM   Specimen: Nasopharyngeal Swab  Result Value Ref Range Status   SARS Coronavirus 2 NEGATIVE NEGATIVE Final    Comment: (NOTE) If result is NEGATIVE SARS-CoV-2 target nucleic acids  are NOT DETECTED. The SARS-CoV-2 RNA is generally detectable in upper and lower  respiratory specimens during the acute phase of infection. The lowest  concentration of SARS-CoV-2 viral copies this assay can detect is 250  copies / mL. A negative result does not preclude SARS-CoV-2 infection  and should not be used as the sole basis for treatment or other  patient management decisions.  A negative result may occur with  improper specimen collection / handling, submission of specimen other  than nasopharyngeal swab, presence of viral mutation(s) within the  areas targeted by this assay, and inadequate number of viral copies  (<250 copies / mL). A negative result must be combined with clinical  observations, patient history, and epidemiological information. If result is POSITIVE SARS-CoV-2 target nucleic acids are DETECTED. The SARS-CoV-2 RNA is generally detectable in upper and lower  respiratory specimens dur ing the acute phase of infection.  Positive  results are indicative of active infection with SARS-CoV-2.  Clinical  correlation with patient history and other diagnostic information is  necessary to determine patient infection status.  Positive results do  not rule out bacterial infection or co-infection with other viruses. If result is PRESUMPTIVE POSTIVE SARS-CoV-2 nucleic acids MAY BE PRESENT.   A presumptive positive result was obtained on the submitted specimen  and confirmed on repeat testing.  While 2019 novel coronavirus  (SARS-CoV-2) nucleic acids may be present in the submitted sample  additional confirmatory testing may be necessary for epidemiological  and / or clinical management purposes  to differentiate between  SARS-CoV-2 and other Sarbecovirus currently known to infect humans.  If clinically indicated additional testing with an alternate test  methodology [phone]) is advised. The SARS-CoV-2 RNA is generally  detectable in upper and lower respiratory sp ecimens  during the acute  phase of infection. The expected result is Negative. Fact Sheet for Patients:  StrictlyIdeas.no Fact Sheet for Healthcare Providers: BankingDealers.co.za This test is not yet approved or cleared by the [place] FDA and has been authorized for detection and/or diagnosis of SARS-CoV-2 by FDA under an Emergency Use Authorization (EUA).  This EUA will remain in effect (meaning this test can be used) for the duration of the COVID-19 declaration under Section 564(b)(1) of the Act, 21 U.S.C. section 360bbb-3(b)(1), unless the authorization is terminated or revoked sooner. Performed at [facility], [place] [address], [place], [place] [address]    Creatinine: Recent Labs    06/01/19 1333  CREATININE 0.71   Baseline Creatinine: 0.7  Impression/Assessment:  33yo with  Plan:  1. The risks/benefits/alternatives to right ureteral stent placement was explained to the patient and she understands and wishes to proceed with surgery  Nicolette Bang 06/03/2019, 7:34 PM

## 2019-06-03 NOTE — Discharge Instructions (Signed)

## 2019-06-03 NOTE — Anesthesia Procedure Notes (Signed)
Procedure Name: LMA Insertion Date/Time: 06/03/2019 8:09 PM Performed by: Montel Clock, CRNA Pre-anesthesia Checklist: Patient identified, Emergency Drugs available, Suction available, Patient being monitored and Timeout performed Patient Re-evaluated:Patient Re-evaluated prior to induction Oxygen Delivery Method: Circle system utilized Preoxygenation: Pre-oxygenation with 100% oxygen Induction Type: IV induction LMA: LMA with gastric port inserted LMA Size: 4.0 Number of attempts: 1 Placement Confirmation: positive ETCO2 and breath sounds checked- equal and bilateral Tube secured with: Tape Dental Injury: Teeth and Oropharynx as per pre-operative assessment

## 2019-06-03 NOTE — Transfer of Care (Signed)
Immediate Anesthesia Transfer of Care Note  Patient: Tonya Brennan  Procedure(s) Performed: CYSTOSCOPY WITH RETROGRADE PYELOGRAM/URETERAL STENT PLACEMENT/STONE REMOVAL WITH BASKET (Right )  Patient Location: PACU  Anesthesia Type:General  Level of Consciousness: drowsy and patient cooperative  Airway & Oxygen Therapy: Patient Spontanous Breathing and Patient connected to face mask oxygen  Post-op Assessment: Report given to RN and Post -op Vital signs reviewed and stable  Post vital signs: Reviewed and stable  Last Vitals:  Vitals Value Taken Time  BP    Temp    Pulse 76 06/03/19 2054  Resp 11 06/03/19 2054  SpO2 100 % 06/03/19 2054  Vitals shown include unvalidated device data.  Last Pain:  Vitals:   06/03/19 1855  TempSrc:   PainSc: 0-No pain      Patients Stated Pain Goal: 4 ([id number])  Complications: No apparent anesthesia complications

## 2019-06-03 NOTE — Anesthesia Preprocedure Evaluation (Addendum)
Anesthesia Evaluation  Patient identified by MRN, date of birth, ID band Patient awake    Reviewed: Allergy & Precautions, NPO status , Patient's Chart, lab work & pertinent test results  History of Anesthesia Complications Negative for: history of anesthetic complications  Airway Mallampati: II  TM Distance: >3 FB Neck ROM: Full    Dental  (+) Dental Advisory Given, Chipped   Pulmonary former smoker,    Pulmonary exam normal        Cardiovascular negative cardio ROS Normal cardiovascular exam     Neuro/Psych negative neurological ROS  negative psych ROS   GI/Hepatic negative GI ROS, (+)     substance abuse  marijuana use,   Endo/Other   Hyperparathyroidism   Renal/GU Renal disease Kidney stones      Musculoskeletal negative musculoskeletal ROS (+)   Abdominal   Peds  Hematology negative hematology ROS (+)   Anesthesia Other Findings   Reproductive/Obstetrics                            Anesthesia Physical Anesthesia Plan  ASA: II  Anesthesia Plan: General   Post-op Pain Management:    Induction: Intravenous  PONV Risk Score and Plan: 3 and Treatment may vary due to age or medical condition, Ondansetron, Dexamethasone and Midazolam  Airway Management Planned: LMA  Additional Equipment: None  Intra-op Plan:   Post-operative Plan: Extubation in OR  Informed Consent: I have reviewed the patients History and Physical, chart, labs and discussed the procedure including the risks, benefits and alternatives for the proposed anesthesia with the patient or authorized representative who has indicated his/her understanding and acceptance.     Dental advisory given  Plan Discussed with: CRNA and Anesthesiologist  Anesthesia Plan Comments:        Anesthesia Quick Evaluation

## 2019-06-03 NOTE — Op Note (Signed)
Preoperative diagnosis: Right ureteral stone  Postoperative diagnosis: Same  Procedure: 1 cystoscopy 2 right retrograde pyelography 3.  Intraoperative fluoroscopy, under one hour, with interpretation 4.  Right ureteroscopic stone manipulation with basket extraction 5.  Right 6 x 26 JJ stent placement  Attending: Rosie Fate  Anesthesia: General  Estimated blood loss: None  Drains: Right 6 x 26 JJ ureteral stent with tether  Specimens: stone for analysis  Antibiotics: ancef  Findings: false passage medially at the UPJ requiring ureteroscopy and direct vision to advance the sensor wire into the renal pelvis. Impacted right UPJ calculus growing ito the medial wall of the UPJ.  Indications: Patient is a 33 year old female with a history of a right ureteral stone and who has failed medical expulsive therapy.  After discussing treatment options, she decided proceed with right stent placement.  Procedure in detail: The patient was brought to the operating room and a brief timeout was done to ensure correct patient, correct procedure, correct site.  General anesthesia was administered patient was placed in dorsal lithotomy position.  Her genitalia was then prepped and draped in usual sterile fashion.  A rigid 7 French cystoscope was passed in the urethra and the bladder.  Bladder was inspected free masses or lesions.  the right ureteral orifices were in the normal orthotopic locations.  a 6 french ureteral catheter was then instilled into the right ureter orifice.  a gentle retrograde was obtained and findings noted above.  We then attempted to advance the sensor wire into the renal pelvis which was unsuccessful due to a false passage at the UPJ.   we then removed the cystoscope and cannulated the right ureteral orifice with a semirigid ureteroscope.  we then encountered the stone at the UPJ which was removed with an NGage basket. We then repeated the ureteroscopy to the UPJ and advanced the  sensor wire into the renal pelvis.  we then placed a 6 x 26 double-j ureteral stent over the sensor wire.  We then removed the wire and good coil was noted in the the renal pelvis under fluoroscopy and the bladder under direct vision.   the bladder was then drained and this concluded the procedure which was well tolerated by patient.  Complications: None  Condition: Stable, extubated, transferred to PACU  Plan: Patient is to be discharged home as to follow-up in one week. She is to remove her stent by pulling the tether in 72 hours

## 2019-06-04 ENCOUNTER — Encounter (HOSPITAL_COMMUNITY): Payer: Self-pay | Admitting: Urology

## 2019-06-07 NOTE — Anesthesia Postprocedure Evaluation (Signed)
Anesthesia Post Note  Patient: Tonya Brennan  Procedure(s) Performed: CYSTOSCOPY WITH RETROGRADE PYELOGRAM/URETERAL STENT PLACEMENT/STONE REMOVAL WITH BASKET (Right )     Patient location during evaluation: PACU Anesthesia Type: General Level of consciousness: awake and alert Pain management: pain level controlled Vital Signs Assessment: post-procedure vital signs reviewed and stable Respiratory status: spontaneous breathing, nonlabored ventilation and respiratory function stable Cardiovascular status: blood pressure returned to baseline and stable Postop Assessment: no apparent nausea or vomiting Anesthetic complications: no    Last Vitals:  Vitals:   06/03/19 2130 06/03/19 2140  BP: (!) 140/98 (!) 146/99  Pulse: 74 72  Resp: 16 16  Temp: 36.6 C 36.6 C  SpO2: 99% 98%    Last Pain:  Vitals:   06/03/19 2140  TempSrc:   PainSc: 0-No pain                 Audry Pili

## 2019-07-13 ENCOUNTER — Emergency Department (HOSPITAL_BASED_OUTPATIENT_CLINIC_OR_DEPARTMENT_OTHER)
Admission: EM | Admit: 2019-07-13 | Discharge: 2019-07-13 | Disposition: A | Discharge status: Home / Self Care | Payer: Self-pay | Attending: Emergency Medicine | Admitting: Emergency Medicine

## 2019-07-13 ENCOUNTER — Other Ambulatory Visit: Payer: Self-pay

## 2019-07-13 ENCOUNTER — Encounter (HOSPITAL_BASED_OUTPATIENT_CLINIC_OR_DEPARTMENT_OTHER): Payer: Self-pay | Admitting: *Deleted

## 2019-07-13 ENCOUNTER — Emergency Department (HOSPITAL_BASED_OUTPATIENT_CLINIC_OR_DEPARTMENT_OTHER): Payer: Self-pay

## 2019-07-13 DIAGNOSIS — Z3202 Encounter for pregnancy test, result negative: Secondary | ICD-10-CM | POA: Insufficient documentation

## 2019-07-13 DIAGNOSIS — Z87891 Personal history of nicotine dependence: Secondary | ICD-10-CM | POA: Insufficient documentation

## 2019-07-13 DIAGNOSIS — N201 Calculus of ureter: Secondary | ICD-10-CM | POA: Insufficient documentation

## 2019-07-13 LAB — URINALYSIS, ROUTINE W REFLEX MICROSCOPIC
Bilirubin Urine: NEGATIVE
Glucose, UA: NEGATIVE mg/dL
Hgb urine dipstick: NEGATIVE
Ketones, ur: NEGATIVE mg/dL
Leukocytes,Ua: NEGATIVE
Nitrite: NEGATIVE
Protein, ur: NEGATIVE mg/dL
Specific Gravity, Urine: 1.025 (ref 1.005–1.030)
pH: 6 (ref 5.0–8.0)

## 2019-07-13 LAB — CBC WITH DIFFERENTIAL/PLATELET
Abs Immature Granulocytes: 0.03 10*3/uL (ref 0.00–0.07)
Basophils Absolute: 0 10*3/uL (ref 0.0–0.1)
Basophils Relative: 0 %
Eosinophils Absolute: 0 10*3/uL (ref 0.0–0.5)
Eosinophils Relative: 0 %
HCT: 43.2 % (ref 36.0–46.0)
Hemoglobin: 13.5 g/dL (ref 12.0–15.0)
Immature Granulocytes: 0 %
Lymphocytes Relative: 38 %
Lymphs Abs: 2.8 10*3/uL (ref 0.7–4.0)
MCH: 29.7 pg (ref 26.0–34.0)
MCHC: 31.3 g/dL (ref 30.0–36.0)
MCV: 95.2 fL (ref 80.0–100.0)
Monocytes Absolute: 0.4 10*3/uL (ref 0.1–1.0)
Monocytes Relative: 5 %
Neutro Abs: 4 10*3/uL (ref 1.7–7.7)
Neutrophils Relative %: 57 %
Platelets: 256 10*3/uL (ref 150–400)
RBC: 4.54 MIL/uL (ref 3.87–5.11)
RDW: 13.6 % (ref 11.5–15.5)
WBC: 7.2 10*3/uL (ref 4.0–10.5)
nRBC: 0 % (ref 0.0–0.2)

## 2019-07-13 LAB — BASIC METABOLIC PANEL
Anion gap: 5 (ref 5–15)
BUN: 9 mg/dL (ref 6–20)
CO2: 21 mmol/L — ABNORMAL LOW (ref 22–32)
Calcium: 11.4 mg/dL — ABNORMAL HIGH (ref 8.9–10.3)
Chloride: 109 mmol/L (ref 98–111)
Creatinine, Ser: 0.71 mg/dL (ref 0.44–1.00)
GFR calc Af Amer: >60 mL/min (ref >60)
GFR calc non Af Amer: >60 mL/min (ref >60)
Glucose, Bld: 115 mg/dL — ABNORMAL HIGH (ref 70–99)
Potassium: 4 mmol/L (ref 3.5–5.1)
Sodium: 135 mmol/L (ref 135–145)

## 2019-07-13 LAB — PREGNANCY, URINE: Preg Test, Ur: NEGATIVE

## 2019-07-13 MED ORDER — KETOROLAC TROMETHAMINE 30 MG/ML IJ SOLN
30.0000 mg | Freq: Once | INTRAMUSCULAR | Status: AC
  Administered 2019-07-13: 30 mg via INTRAVENOUS
  Filled 2019-07-13: qty 1

## 2019-07-13 MED ORDER — ONDANSETRON HCL 4 MG PO TABS: 4.0000 mg | ORAL_TABLET | Freq: Three times a day (TID) | ORAL | 0 refills | Status: DC | PRN

## 2019-07-13 MED ORDER — HYDROCODONE-ACETAMINOPHEN 5-325 MG PO TABS: 1.0000 | ORAL_TABLET | Freq: Four times a day (QID) | ORAL | 0 refills | Status: DC | PRN

## 2019-07-13 NOTE — ED Notes (Signed)
ED Provider at bedside. 

## 2019-07-13 NOTE — ED Provider Notes (Signed)
[facility] Provider Note   CSN: [id number] Arrival date & time: 07/13/19  1044     History   Chief Complaint Chief Complaint  Patient presents with  . Back Pain    HPI Tonya Brennan is a 33 y.o. female with a past medical history of hyperparathyroidism, kidney stones, stent placed on right side on 06/03/2019 ED for evaluation of continued right-sided back pain.  States that she has had intermittent symptoms since her stent placement, which she removed herself after 72 hours.  Symptoms have gotten worse in the past 2 days.  Reports that sharp pain that has not improved with her home pain medication given after her surgery.  Denies any fever, urinary symptoms, injury or fall, changes to bowel movements, vomiting, nausea.  She was told in the past that her kidney stones are due to her hyperparathyroidism but has not yet established plan with her PCP to get this treated. She states the pain feels similar to her prior kidney stones.     HPI  Past Medical History:  Diagnosis Date  . History of kidney stones   . Kidney stones   . Thyroid disease     Patient Active Problem List   Diagnosis Date Noted  . Cyst, ovary, dermoid, right 11/25/2018  . Hyperparathyroidism ([place]) 09/13/2018  . Nephrolithiasis 08/10/2018  . Right kidney stone 08/10/2018    Past Surgical History:  Procedure Laterality Date  . CYSTOSCOPY W/ URETERAL STENT PLACEMENT Right 06/03/2019   Procedure: CYSTOSCOPY WITH RETROGRADE PYELOGRAM/URETERAL STENT PLACEMENT/STONE REMOVAL WITH BASKET;  Surgeon: Cleon Gustin, MD;  Location: [facility];  Service: Urology;  Laterality: Right;  . IR NEPHROSTOGRAM RIGHT THRU EXISTING ACCESS  08/14/2018  . IR URETERAL STENT PLACEMENT EXISTING ACCESS RIGHT  08/19/2018  . IR URETERAL STENT RIGHT NEW ACCESS W/O SEP NEPHROSTOMY CATH  08/10/2018  . NEPHROLITHOTOMY Right 08/11/2018   Procedure: NEPHROLITHOTOMY PERCUTANEOUS;  Surgeon: Irine Seal, MD;   Location: [facility];  Service: Urology;  Laterality: Right;     OB History   No obstetric history on file.      Home Medications    Prior to Admission medications   Medication Sig Start Date End Date Taking? Authorizing Provider  HYDROcodone-acetaminophen (NORCO/VICODIN) 5-325 MG tablet Take 1 tablet by mouth every 6 (six) hours as needed. 07/13/19   Nikkolas Coomes, PA-C  ondansetron (ZOFRAN) 4 MG tablet Take 1 tablet (4 mg total) by mouth every 8 (eight) hours as needed for nausea or vomiting. 07/13/19   Delia Heady, PA-C    Family History No family history on file.  Social History Social History   Tobacco Use  . Smoking status: Former Research scientist (life sciences)  . Smokeless tobacco: Never Used  Substance Use Topics  . Alcohol use: Never    Frequency: Never  . Drug use: Yes    Types: Marijuana    Comment: daily use      Allergies   Percocet [oxycodone-acetaminophen] and Theraflu max-d cold & flu [pseudoephedrine-dm-gg-apap]   Review of Systems Review of Systems  Constitutional: Negative for appetite change, chills and fever.  HENT: Negative for ear pain, rhinorrhea, sneezing and sore throat.   Eyes: Negative for photophobia and visual disturbance.  Respiratory: Negative for cough, chest tightness, shortness of breath and wheezing.   Cardiovascular: Negative for chest pain and palpitations.  Gastrointestinal: Negative for abdominal pain, blood in stool, constipation, diarrhea, nausea and vomiting.  Genitourinary: Positive for flank pain. Negative for dysuria, hematuria and urgency.  Musculoskeletal:  Positive for back pain. Negative for myalgias.  Skin: Negative for rash.  Neurological: Negative for dizziness, weakness and light-headedness.     Physical Exam Updated Vital Signs BP (!) 139/106 (BP Location: Right Arm)   Pulse 70   Temp 97.9 F (36.6 C) (Oral)   Resp 18   Ht 5\' 6"  (1.676 m)   Wt 78.2 kg   LMP 06/24/2019   SpO2 100%   BMI 27.83 kg/m   Physical Exam Vitals  signs and nursing note reviewed.  Constitutional:      General: She is not in acute distress.    Appearance: She is well-developed.  HENT:     Head: Normocephalic and atraumatic.     Nose: Nose normal.  Eyes:     General: No scleral icterus.       Right eye: No discharge.        Left eye: No discharge.     Conjunctiva/sclera: Conjunctivae normal.  Neck:     Musculoskeletal: Normal range of motion and neck supple.  Cardiovascular:     Rate and Rhythm: Normal rate and regular rhythm.     Heart sounds: Normal heart sounds. No murmur. No friction rub. No gallop.   Pulmonary:     Effort: Pulmonary effort is normal. No respiratory distress.     Breath sounds: Normal breath sounds.  Abdominal:     General: Bowel sounds are normal. There is no distension.     Palpations: Abdomen is soft.     Tenderness: There is abdominal tenderness (Right flank). There is no guarding.  Musculoskeletal: Normal range of motion.  Skin:    General: Skin is warm and dry.     Findings: No rash.  Neurological:     Mental Status: She is alert.     Motor: No abnormal muscle tone.     Coordination: Coordination normal.      ED Treatments / Results  Labs (all labs ordered are listed, but only abnormal results are displayed) Labs Reviewed  BASIC METABOLIC PANEL - Abnormal; Notable for the following components:      Result Value   CO2 21 (*)    Glucose, Bld 115 (*)    Calcium 11.4 (*)    All other components within normal limits  PREGNANCY, URINE  URINALYSIS, ROUTINE W REFLEX MICROSCOPIC  CBC WITH DIFFERENTIAL/PLATELET    EKG None  Radiology Ct Renal Stone Study  Result Date: 07/13/2019 CLINICAL DATA:  Flank pain. EXAM: CT ABDOMEN AND PELVIS WITHOUT CONTRAST TECHNIQUE: Multidetector CT imaging of the abdomen and pelvis was performed following the standard protocol without IV contrast. COMPARISON:  06/01/2019. FINDINGS: Lower chest: No acute abnormality. Hepatobiliary: No focal liver abnormality is  seen. No gallstones, gallbladder wall thickening, or biliary dilatation. Pancreas: Unremarkable. No pancreatic ductal dilatation or surrounding inflammatory changes. Spleen: Normal in size without focal abnormality. Adrenals/Urinary Tract: Redemonstration of the numerous right-sided renal calculi. Right kidney is scarred and mildly atrophic. There is a tiny punctate calcification within the proximal right ureter near the ureteropelvic junction (series 2, image 49). Mild right-sided hydronephrosis. Unchanged 9 mm calcification within the midpole of the left kidney. No left-sided hydronephrosis. Urinary bladder is incompletely distended but appears mildly thickened. Adrenal glands unremarkable. Stomach/Bowel: Stomach is within normal limits. Appendix appears normal. No evidence of bowel wall thickening, distention, or inflammatory changes. Vascular/Lymphatic: No significant vascular findings are present. No enlarged abdominal or pelvic lymph nodes. Reproductive: Right ovarian dermoid measuring approximately 5.8 x 5.3 cm containing fat, calcium,  and soft tissue density (previously measured 5.4 x 4.8 cm on 08/03/2018). Uterus and left adnexa are unremarkable. Other: No ascites. Musculoskeletal: Sequela of chronic bilateral sacroiliitis. No acute osseous findings. IMPRESSION: 1. Multiple bilateral renal calculi. A tiny punctate stone is seen within the proximal right ureter near the UPJ causing moderate hydronephrosis. 2. Slight interval enlargement of right ovarian dermoid currently measuring up to 5.8 cm (previously 5.4 cm on 08/03/2018). 3. Sequela of chronic bilateral sacroiliitis. Electronically Signed   By: Davina Poke M.D.   On: 07/13/2019 12:06    Procedures Procedures (including critical care time)  Medications Ordered in ED Medications  ketorolac (TORADOL) 30 MG/ML injection 30 mg (30 mg Intravenous Given 07/13/19 1218)     Initial Impression / Assessment and Plan / ED Course  I have reviewed  the triage vital signs and the nursing notes.  Pertinent labs & imaging results that were available during my care of the patient were reviewed by me and considered in my medical decision making (see chart for details).        33 year old female with a past medical history of hyperparathyroidism and kidney stones presents to ED for evaluation of right-sided flank pain and back pain for the past 2 days.  Symptoms have been intermittent since her stent placement about a month ago.  No improvement with her home pain medication.  States that this feels similar to her prior kidney stones.  She denies any fever, nausea, vomiting, changes to bowel movements or urination.  She has tenderness to palpation of the right flank on exam without rebound or guarding.  Vital signs are within normal limits.  Lab work shows calcium of 11.4 which is similar to prior.  Urinalysis is unremarkable.  CBC is unremarkable.  CT renal stone study shows small proximal right sided ureteral stone.  Patient's pain controlled him in the ED.  We will have her follow-up with urology and give symptomatic control for home.  Patient is hemodynamically stable, in NAD, and able to ambulate in the ED. Evaluation does not show pathology that would require ongoing emergent intervention or inpatient treatment. I explained the diagnosis to the patient. Pain has been managed and has no complaints prior to discharge. Patient is comfortable with above plan and is stable for discharge at this time. All questions were answered prior to disposition. Strict return precautions for returning to the ED were discussed. Encouraged follow up with PCP.   An After Visit Summary was printed and given to the patient.   Portions of this note were generated with [employer]. Dictation errors may occur despite best attempts at proofreading.   Final Clinical Impressions(s) / ED Diagnoses   Final diagnoses:  Ureterolithiasis  Hypercalcemia     ED Discharge Orders         Ordered    HYDROcodone-acetaminophen (NORCO/VICODIN) 5-325 MG tablet  Every 6 hours PRN     07/13/19 1224    ondansetron (ZOFRAN) 4 MG tablet  Every 8 hours PRN     07/13/19 1224           Delia Heady, PA-C 07/13/19 1227    Maudie Flakes, MD 07/16/19 [phone]

## 2019-07-13 NOTE — Discharge Instructions (Signed)
Take pain medication as directed. Follow up with your urologist for further management of your kidney stone.

## 2019-07-13 NOTE — ED Triage Notes (Signed)
Back pain for a couple of weeks and sharp pain for 2 days.  Had a kidney surgery 3 weeks a ago.  Patient stated that she still have a kidney stone.

## 2020-04-05 ENCOUNTER — Emergency Department (HOSPITAL_BASED_OUTPATIENT_CLINIC_OR_DEPARTMENT_OTHER)
Admission: EM | Admit: 2020-04-05 | Discharge: 2020-04-05 | Disposition: A | Discharge status: Home / Self Care | Payer: Self-pay | Attending: Emergency Medicine | Admitting: Emergency Medicine

## 2020-04-05 ENCOUNTER — Emergency Department (HOSPITAL_BASED_OUTPATIENT_CLINIC_OR_DEPARTMENT_OTHER): Payer: Self-pay

## 2020-04-05 ENCOUNTER — Other Ambulatory Visit: Payer: Self-pay

## 2020-04-05 ENCOUNTER — Encounter (HOSPITAL_BASED_OUTPATIENT_CLINIC_OR_DEPARTMENT_OTHER): Payer: Self-pay | Admitting: Emergency Medicine

## 2020-04-05 DIAGNOSIS — D351 Benign neoplasm of parathyroid gland: Secondary | ICD-10-CM | POA: Insufficient documentation

## 2020-04-05 DIAGNOSIS — M549 Dorsalgia, unspecified: Secondary | ICD-10-CM | POA: Insufficient documentation

## 2020-04-05 DIAGNOSIS — Z87891 Personal history of nicotine dependence: Secondary | ICD-10-CM | POA: Insufficient documentation

## 2020-04-05 LAB — BASIC METABOLIC PANEL
Anion gap: 6 (ref 5–15)
BUN: 13 mg/dL (ref 6–20)
CO2: 26 mmol/L (ref 22–32)
Calcium: 12.1 mg/dL — ABNORMAL HIGH (ref 8.9–10.3)
Chloride: 107 mmol/L (ref 98–111)
Creatinine, Ser: 0.73 mg/dL (ref 0.44–1.00)
GFR calc Af Amer: >60 mL/min (ref >60)
GFR calc non Af Amer: >60 mL/min (ref >60)
Glucose, Bld: 100 mg/dL — ABNORMAL HIGH (ref 70–99)
Potassium: 4.5 mmol/L (ref 3.5–5.1)
Sodium: 139 mmol/L (ref 135–145)

## 2020-04-05 LAB — PREGNANCY, URINE: Preg Test, Ur: NEGATIVE

## 2020-04-05 LAB — CBC WITH DIFFERENTIAL/PLATELET
Abs Immature Granulocytes: 0.02 10*3/uL (ref 0.00–0.07)
Basophils Absolute: 0 10*3/uL (ref 0.0–0.1)
Basophils Relative: 0 %
Eosinophils Absolute: 0 10*3/uL (ref 0.0–0.5)
Eosinophils Relative: 0 %
HCT: 44.4 % (ref 36.0–46.0)
Hemoglobin: 14.4 g/dL (ref 12.0–15.0)
Immature Granulocytes: 0 %
Lymphocytes Relative: 36 %
Lymphs Abs: 2.9 10*3/uL (ref 0.7–4.0)
MCH: 31 pg (ref 26.0–34.0)
MCHC: 32.4 g/dL (ref 30.0–36.0)
MCV: 95.7 fL (ref 80.0–100.0)
Monocytes Absolute: 0.5 10*3/uL (ref 0.1–1.0)
Monocytes Relative: 7 %
Neutro Abs: 4.5 10*3/uL (ref 1.7–7.7)
Neutrophils Relative %: 57 %
Platelets: 247 10*3/uL (ref 150–400)
RBC: 4.64 MIL/uL (ref 3.87–5.11)
RDW: 13.3 % (ref 11.5–15.5)
WBC: 8 10*3/uL (ref 4.0–10.5)
nRBC: 0 % (ref 0.0–0.2)

## 2020-04-05 LAB — URINALYSIS, ROUTINE W REFLEX MICROSCOPIC
Bilirubin Urine: NEGATIVE
Glucose, UA: NEGATIVE mg/dL
Ketones, ur: NEGATIVE mg/dL
Leukocytes,Ua: NEGATIVE
Nitrite: NEGATIVE
Protein, ur: NEGATIVE mg/dL
Specific Gravity, Urine: 1.02 (ref 1.005–1.030)
pH: 6.5 (ref 5.0–8.0)

## 2020-04-05 LAB — URINALYSIS, MICROSCOPIC (REFLEX)

## 2020-04-05 MED ORDER — ALUM & MAG HYDROXIDE-SIMETH 200-200-20 MG/5ML PO SUSP
30.0000 mL | Freq: Once | ORAL | Status: AC
  Administered 2020-04-05: 30 mL via ORAL
  Filled 2020-04-05: qty 30

## 2020-04-05 MED ORDER — METHOCARBAMOL 500 MG PO TABS: 500.0000 mg | ORAL_TABLET | Freq: Two times a day (BID) | ORAL | 0 refills | Status: DC | PRN

## 2020-04-05 MED ORDER — IOHEXOL 300 MG/ML  SOLN
100.0000 mL | Freq: Once | INTRAMUSCULAR | Status: AC | PRN
  Administered 2020-04-05: 100 mL via INTRAVENOUS

## 2020-04-05 MED ORDER — LIDOCAINE VISCOUS HCL 2 % MT SOLN: 15.0000 mL | OROMUCOSAL | 0 refills | Status: DC | PRN

## 2020-04-05 MED ORDER — LIDOCAINE VISCOUS HCL 2 % MT SOLN
15.0000 mL | Freq: Once | OROMUCOSAL | Status: AC
  Administered 2020-04-05: 15 mL via ORAL
  Filled 2020-04-05: qty 15

## 2020-04-05 NOTE — ED Provider Notes (Signed)
[facility] Provider Note   CSN: [id number] Arrival date & time: 04/05/20  1105     History Chief Complaint  Patient presents with  . Sore throat - Flank pain    Tonya Brennan is a 34 y.o. female resenting for evaluation of throat swelling and pain and back pain.  Patient states for the past 3 weeks, she has had worsening feeling of swelling in her throat, making it difficult to eat solid foods.  She is tolerating liquids without difficulty.  She states it hurts when she swallows.  She states she has a history of issues with her thyroid, was told she needed surgery but never followed up.  Additionally, she reports over the past several weeks to months she has had worsened back pain.  It is constant, worse with movement.  It is on both sides.  It does not radiate anywhere.  She denies fall, trauma, or injury.  She is taking ibuprofen without improvement of symptoms, and then stopped.  She has not been on any muscle x-rays.  She does report a history of chronic back pain in the past, but states this feels different.  She denies fevers, chills, cough, chest pain, shortness of breath, nausea, vomiting, abdominal pain, urinary symptoms, abnormal bowel movements.  HPI     Past Medical History:  Diagnosis Date  . History of kidney stones   . Kidney stones   . Thyroid disease     Patient Active Problem List   Diagnosis Date Noted  . Cyst, ovary, dermoid, right 11/25/2018  . Hyperparathyroidism ([place]) 09/13/2018  . Nephrolithiasis 08/10/2018  . Right kidney stone 08/10/2018    Past Surgical History:  Procedure Laterality Date  . CYSTOSCOPY W/ URETERAL STENT PLACEMENT Right 06/03/2019   Procedure: CYSTOSCOPY WITH RETROGRADE PYELOGRAM/URETERAL STENT PLACEMENT/STONE REMOVAL WITH BASKET;  Surgeon: Cleon Gustin, MD;  Location: [facility];  Service: Urology;  Laterality: Right;  . IR NEPHROSTOGRAM RIGHT THRU EXISTING ACCESS  08/14/2018  . IR URETERAL STENT  PLACEMENT EXISTING ACCESS RIGHT  08/19/2018  . IR URETERAL STENT RIGHT NEW ACCESS W/O SEP NEPHROSTOMY CATH  08/10/2018  . NEPHROLITHOTOMY Right 08/11/2018   Procedure: NEPHROLITHOTOMY PERCUTANEOUS;  Surgeon: Irine Seal, MD;  Location: [facility];  Service: Urology;  Laterality: Right;     OB History   No obstetric history on file.     No family history on file.  Social History   Tobacco Use  . Smoking status: Former Research scientist (life sciences)  . Smokeless tobacco: Never Used  Vaping Use  . Vaping Use: Never used  Substance Use Topics  . Alcohol use: Never  . Drug use: Yes    Types: Marijuana    Comment: daily use     Home Medications Prior to Admission medications   Medication Sig Start Date End Date Taking? Authorizing Provider  HYDROcodone-acetaminophen (NORCO/VICODIN) 5-325 MG tablet Take 1 tablet by mouth every 6 (six) hours as needed. 07/13/19   Khatri, Hina, PA-C  lidocaine (XYLOCAINE) 2 % solution Use as directed 15 mLs in the mouth or throat as needed for mouth pain. 04/05/20   Arville Postlewaite, PA-C  methocarbamol (ROBAXIN) 500 MG tablet Take 1 tablet (500 mg total) by mouth 2 (two) times daily as needed for muscle spasms. 04/05/20   Telly Broberg, PA-C  ondansetron (ZOFRAN) 4 MG tablet Take 1 tablet (4 mg total) by mouth every 8 (eight) hours as needed for nausea or vomiting. 07/13/19   Delia Heady, PA-C    Allergies  Percocet [oxycodone-acetaminophen] and Theraflu max-d cold & flu [pseudoephedrine-dm-gg-apap]  Review of Systems   Review of Systems  HENT: Positive for trouble swallowing.   Musculoskeletal: Positive for back pain.  All other systems reviewed and are negative.   Physical Exam Updated Vital Signs BP (!) 153/114 (BP Location: Right Arm)   Pulse 68   Temp 98.7 F (37.1 C) (Oral)   Resp 16   Ht 5\' 6"  (1.676 m)   Wt 74.6 kg   LMP 02/22/2020 Comment: neg upreg today in er. rr  SpO2 100%   BMI 26.53 kg/m   Physical Exam Vitals and nursing note reviewed.    Constitutional:      General: She is not in acute distress.    Appearance: She is well-developed.     Comments: Appears nontoxic  HENT:     Head: Normocephalic and atraumatic.      Comments: Mild swelling noted over the thyroid gland, appears symmetric. OP clear without tonsillar swelling or exudate.  Uvula midline with equal palate rise.  Handling secretions easily.  Airway grossly intact Eyes:     Extraocular Movements: Extraocular movements intact.     Conjunctiva/sclera: Conjunctivae normal.     Pupils: Pupils are equal, round, and reactive to light.  Cardiovascular:     Rate and Rhythm: Normal rate and regular rhythm.     Pulses: Normal pulses.  Pulmonary:     Effort: Pulmonary effort is normal. No respiratory distress.     Breath sounds: Normal breath sounds. No wheezing.  Abdominal:     General: There is no distension.     Palpations: Abdomen is soft. There is no mass.     Tenderness: There is no abdominal tenderness. There is no guarding or rebound.  Musculoskeletal:        General: Tenderness present. Normal range of motion.     Cervical back: Normal range of motion and neck supple.     Comments: Tenderness palpation of entire bilateral back musculature.  No focal pain over midline spine.  No step-offs or deformities.  No worsened or focal pain over CVA region.  Skin:    General: Skin is warm and dry.     Capillary Refill: Capillary refill takes less than 2 seconds.  Neurological:     Mental Status: She is alert and oriented to person, place, and time.     ED Results / Procedures / Treatments   Labs (all labs ordered are listed, but only abnormal results are displayed) Labs Reviewed  URINALYSIS, ROUTINE W REFLEX MICROSCOPIC - Abnormal; Notable for the following components:      Result Value   Hgb urine dipstick SMALL (*)    All other components within normal limits  URINALYSIS, MICROSCOPIC (REFLEX) - Abnormal; Notable for the following components:   Bacteria, UA  FEW (*)    All other components within normal limits  BASIC METABOLIC PANEL - Abnormal; Notable for the following components:   Glucose, Bld 100 (*)    Calcium 12.1 (*)    All other components within normal limits  PREGNANCY, URINE  CBC WITH DIFFERENTIAL/PLATELET    EKG None  Radiology CT Soft Tissue Neck W Contrast  Result Date: 04/05/2020 CLINICAL DATA:  Throat pain and swelling, difficulty swallowing EXAM: CT NECK WITH CONTRAST TECHNIQUE: Multidetector CT imaging of the neck was performed using the standard protocol following the bolus administration of intravenous contrast. CONTRAST:  174mL OMNIPAQUE IOHEXOL 300 MG/ML  SOLN COMPARISON:  None. FINDINGS: Pharynx and larynx:  Unremarkable.  No mass or swelling. Salivary glands: Unremarkable. Thyroid: Thyroid is unremarkable. There is a heterogeneous mass abutting the lower pole the left lobe measuring up to 2.8 cm. This likely reflects parathyroid adenoma seen on prior imaging. Lymph nodes: No enlarged lymph nodes identified. Vascular: Major neck vessels are patent. Limited intracranial: No abnormal enhancement. Visualized orbits: Unremarkable. Mastoids and visualized paranasal sinuses: Aerated. Skeleton: Mild degenerative changes of the cervical spine. Upper chest: No apical lung mass. Other: Tooth decay is present. There is periapical lucency above the anterior right maxillary molar with dehiscence of the lingual cortex. IMPRESSION: Heterogeneous mass abutting the lower pole of the left thyroid lobe likely reflecting parathyroid adenoma seen on prior imaging. This abuts the esophagus without apparent significant mass effect on these static images. Dental disease. Electronically Signed   By: Macy Mis M.D.   On: 04/05/2020 15:05    Procedures Procedures (including critical care time)  Medications Ordered in ED Medications  alum & mag hydroxide-simeth (MAALOX/MYLANTA) 200-200-20 MG/5ML suspension 30 mL (30 mLs Oral Given 04/05/20 1448)     And  lidocaine (XYLOCAINE) 2 % viscous mouth solution 15 mL (15 mLs Oral Given 04/05/20 1448)  iohexol (OMNIPAQUE) 300 MG/ML solution 100 mL (100 mLs Intravenous Contrast Given 04/05/20 1432)    ED Course  I have reviewed the triage vital signs and the nursing notes.  Pertinent labs & imaging results that were available during my care of the patient were reviewed by me and considered in my medical decision making (see chart for details).    MDM Rules/Calculators/A&P                          Patient presenting for evaluation of throat swelling difficulty swallowing, back pain.  On exam, patient appears nontoxic.  She has a history of "thyroid problems."  On chart review, patient has a history of hypercalcemia, likely due to parathyroid adenoma.  She is reporting increased difficulty swallowing, as such will obtain CT of the neck to ensure no mass-effect or airway compromise.  Urine shows small blood, however patient's back pain has been for several weeks and she is very well-appearing at this time.  Low suspicion for kidney stone.  I do believe she needs CT imaging of her abdomen or back at this time.  Likely MSK, and patient agrees with plan.  Labs interpreted by me, overall reassuring.  Calcium elevated at 12, this is patient's baseline.  CT neck shows parathyroid adenoma similar to previous, no mass-effect.  I discussed findings with patient.  She had slight improvement with a GI cocktail, as such we will prescribe viscous lidocaine to help with discomfort when eating.  Encouraged follow-up with ENT for parathyroid management.  Case discussed with attending, Dr. Sherry Ruffing agrees to plan.  At this time, patient appears safe for discharge.  Return precautions given.  Patient states she understands and agrees to plan.  Final Clinical Impression(s) / ED Diagnoses Final diagnoses:  Hypercalcemia  Parathyroid adenoma    Rx / DC Orders ED Discharge Orders         Ordered    lidocaine (XYLOCAINE) 2  % solution  As needed     Discontinue  Reprint     04/05/20 1520    methocarbamol (ROBAXIN) 500 MG tablet  2 times daily PRN     Discontinue  Reprint     04/05/20 1525           Dekota Shenk,  PA-C 04/05/20 1643    Tegeler, Gwenyth Allegra, MD 04/07/20 1302

## 2020-04-05 NOTE — ED Triage Notes (Signed)
Throat pain that has been getting worse for the past couple years.  STs she has a lump on her thyroid and was supposed to have surgery but Covid hit and everything got postponed.  Also back pain that she is concerned for Kidney stones.  Hx of stones.

## 2020-04-05 NOTE — Discharge Instructions (Addendum)
Take lidocaine as needed for pain control to be able to eat meals.  Use the muscle laxer as needed for muscle pain.  Have caution, this may make you tired or groggy.  Do not drive or operate heavy machine while taking this medicine. It is extremely important that you follow-up regarding your parathyroid lesion.  This is the primary cause for your elevated calcium and overall symptoms. Call the ear nose and throat doctor listed below to set up an appointment for further management of your parathyroid lesion. Return to the ER if you develop fevers, inability to handle your saliva/spit, difficulty breathing, or any new, worsening, or concerning symptoms.

## 2020-04-11 ENCOUNTER — Other Ambulatory Visit: Payer: Self-pay | Admitting: Otolaryngology

## 2020-04-18 ENCOUNTER — Other Ambulatory Visit: Payer: Self-pay

## 2020-04-18 ENCOUNTER — Encounter (HOSPITAL_BASED_OUTPATIENT_CLINIC_OR_DEPARTMENT_OTHER): Payer: Self-pay | Admitting: Otolaryngology

## 2020-04-21 ENCOUNTER — Other Ambulatory Visit (HOSPITAL_COMMUNITY)
Admission: RE | Admit: 2020-04-21 | Discharge: 2020-04-21 | Disposition: A | Discharge status: Home / Self Care | Payer: Self-pay | Source: Ambulatory Visit | Attending: Otolaryngology | Admitting: Otolaryngology

## 2020-04-21 DIAGNOSIS — Z01812 Encounter for preprocedural laboratory examination: Secondary | ICD-10-CM | POA: Insufficient documentation

## 2020-04-21 DIAGNOSIS — Z20822 Contact with and (suspected) exposure to covid-19: Secondary | ICD-10-CM | POA: Insufficient documentation

## 2020-04-21 LAB — SARS CORONAVIRUS 2 (TAT 6-24 HRS): SARS Coronavirus 2: NEGATIVE

## 2020-04-25 ENCOUNTER — Encounter (HOSPITAL_BASED_OUTPATIENT_CLINIC_OR_DEPARTMENT_OTHER): Payer: Self-pay | Admitting: Otolaryngology

## 2020-04-25 ENCOUNTER — Ambulatory Visit (HOSPITAL_BASED_OUTPATIENT_CLINIC_OR_DEPARTMENT_OTHER)
Admission: RE | Admit: 2020-04-25 | Discharge: 2020-04-26 | Disposition: A | Discharge status: Home / Self Care | Payer: Self-pay | Attending: Otolaryngology | Admitting: Otolaryngology

## 2020-04-25 ENCOUNTER — Ambulatory Visit (HOSPITAL_BASED_OUTPATIENT_CLINIC_OR_DEPARTMENT_OTHER): Payer: Self-pay | Admitting: Anesthesiology

## 2020-04-25 ENCOUNTER — Encounter (HOSPITAL_BASED_OUTPATIENT_CLINIC_OR_DEPARTMENT_OTHER)
Admission: RE | Disposition: A | Discharge status: Home / Self Care | Payer: Self-pay | Source: Home / Self Care | Attending: Otolaryngology

## 2020-04-25 ENCOUNTER — Other Ambulatory Visit: Payer: Self-pay

## 2020-04-25 DIAGNOSIS — Z87891 Personal history of nicotine dependence: Secondary | ICD-10-CM | POA: Insufficient documentation

## 2020-04-25 DIAGNOSIS — D351 Benign neoplasm of parathyroid gland: Secondary | ICD-10-CM | POA: Diagnosis present

## 2020-04-25 DIAGNOSIS — R634 Abnormal weight loss: Secondary | ICD-10-CM | POA: Insufficient documentation

## 2020-04-25 DIAGNOSIS — Z6826 Body mass index (BMI) 26.0-26.9, adult: Secondary | ICD-10-CM | POA: Insufficient documentation

## 2020-04-25 DIAGNOSIS — I10 Essential (primary) hypertension: Secondary | ICD-10-CM | POA: Insufficient documentation

## 2020-04-25 HISTORY — PX: PARATHYROIDECTOMY: SHX19

## 2020-04-25 SURGERY — PARATHYROIDECTOMY
Anesthesia: General | Site: Neck | Laterality: Left

## 2020-04-25 MED ORDER — ROCURONIUM BROMIDE 100 MG/10ML IV SOLN
INTRAVENOUS | Status: DC | PRN
  Administered 2020-04-25: 10 mg via INTRAVENOUS

## 2020-04-25 MED ORDER — PROPOFOL 10 MG/ML IV BOLUS
INTRAVENOUS | Status: AC
  Filled 2020-04-25: qty 40

## 2020-04-25 MED ORDER — FENTANYL CITRATE (PF) 100 MCG/2ML IJ SOLN
INTRAMUSCULAR | Status: DC | PRN
  Administered 2020-04-25 (×2): 100 ug via INTRAVENOUS
  Administered 2020-04-25 (×2): 50 ug via INTRAVENOUS

## 2020-04-25 MED ORDER — LIDOCAINE 2% (20 MG/ML) 5 ML SYRINGE
INTRAMUSCULAR | Status: AC
  Filled 2020-04-25: qty 5

## 2020-04-25 MED ORDER — MIDAZOLAM HCL 5 MG/5ML IJ SOLN
INTRAMUSCULAR | Status: DC | PRN
  Administered 2020-04-25: 2 mg via INTRAVENOUS

## 2020-04-25 MED ORDER — LACTATED RINGERS IV SOLN: INTRAVENOUS | Status: DC

## 2020-04-25 MED ORDER — FENTANYL CITRATE (PF) 100 MCG/2ML IJ SOLN
25.0000 ug | INTRAMUSCULAR | Status: DC | PRN
  Administered 2020-04-25 (×2): 50 ug via INTRAVENOUS

## 2020-04-25 MED ORDER — DEXMEDETOMIDINE HCL 200 MCG/2ML IV SOLN
INTRAVENOUS | Status: DC | PRN
  Administered 2020-04-25: 4 ug via INTRAVENOUS
  Administered 2020-04-25 (×2): 8 ug via INTRAVENOUS

## 2020-04-25 MED ORDER — SUCCINYLCHOLINE CHLORIDE 20 MG/ML IJ SOLN
INTRAMUSCULAR | Status: DC | PRN
  Administered 2020-04-25: 160 mg via INTRAVENOUS

## 2020-04-25 MED ORDER — HYDROCODONE-ACETAMINOPHEN 5-325 MG PO TABS: 1.0000 | ORAL_TABLET | ORAL | 0 refills | Status: AC | PRN

## 2020-04-25 MED ORDER — LIDOCAINE HCL (CARDIAC) PF 100 MG/5ML IV SOSY
PREFILLED_SYRINGE | INTRAVENOUS | Status: DC | PRN
  Administered 2020-04-25: 100 mg via INTRAVENOUS

## 2020-04-25 MED ORDER — MIDAZOLAM HCL 2 MG/2ML IJ SOLN
INTRAMUSCULAR | Status: AC
  Filled 2020-04-25: qty 2

## 2020-04-25 MED ORDER — CEFAZOLIN SODIUM-DEXTROSE 2-3 GM-%(50ML) IV SOLR
INTRAVENOUS | Status: DC | PRN
  Administered 2020-04-25: 2 g via INTRAVENOUS

## 2020-04-25 MED ORDER — MORPHINE SULFATE (PF) 4 MG/ML IV SOLN: 2.0000 mg | INTRAVENOUS | Status: DC | PRN

## 2020-04-25 MED ORDER — FENTANYL CITRATE (PF) 100 MCG/2ML IJ SOLN
INTRAMUSCULAR | Status: AC
  Filled 2020-04-25: qty 2

## 2020-04-25 MED ORDER — PROPOFOL 500 MG/50ML IV EMUL
INTRAVENOUS | Status: DC | PRN
  Administered 2020-04-25: 100 ug/kg/min via INTRAVENOUS

## 2020-04-25 MED ORDER — ONDANSETRON HCL 4 MG/2ML IJ SOLN
INTRAMUSCULAR | Status: DC | PRN
  Administered 2020-04-25: 4 mg via INTRAVENOUS

## 2020-04-25 MED ORDER — ONDANSETRON HCL 4 MG/2ML IJ SOLN: 4.0000 mg | INTRAMUSCULAR | Status: DC | PRN

## 2020-04-25 MED ORDER — SUCCINYLCHOLINE CHLORIDE 200 MG/10ML IV SOSY
PREFILLED_SYRINGE | INTRAVENOUS | Status: AC
  Filled 2020-04-25: qty 10

## 2020-04-25 MED ORDER — DEXAMETHASONE SODIUM PHOSPHATE 4 MG/ML IJ SOLN
INTRAMUSCULAR | Status: DC | PRN
  Administered 2020-04-25: 10 mg via INTRAVENOUS

## 2020-04-25 MED ORDER — PROPOFOL 10 MG/ML IV BOLUS
INTRAVENOUS | Status: DC | PRN
  Administered 2020-04-25: 200 mg via INTRAVENOUS

## 2020-04-25 MED ORDER — ONDANSETRON HCL 4 MG PO TABS
4.0000 mg | ORAL_TABLET | ORAL | Status: DC | PRN
  Administered 2020-04-26: 4 mg via ORAL
  Filled 2020-04-25: qty 1

## 2020-04-25 MED ORDER — AMOXICILLIN 875 MG PO TABS: 875.0000 mg | ORAL_TABLET | Freq: Two times a day (BID) | ORAL | 0 refills | Status: AC

## 2020-04-25 MED ORDER — ACETAMINOPHEN 500 MG PO TABS: 1000.0000 mg | ORAL_TABLET | Freq: Once | ORAL | Status: DC

## 2020-04-25 MED ORDER — HYDROCODONE-ACETAMINOPHEN 5-325 MG PO TABS
1.0000 | ORAL_TABLET | ORAL | Status: DC | PRN
  Administered 2020-04-25: 2 via ORAL
  Administered 2020-04-25: 1 via ORAL
  Administered 2020-04-25: 2 via ORAL
  Administered 2020-04-26: 1 via ORAL
  Filled 2020-04-25 (×2): qty 1
  Filled 2020-04-25 (×3): qty 2

## 2020-04-25 MED ORDER — LIDOCAINE-EPINEPHRINE 1 %-1:100000 IJ SOLN
INTRAMUSCULAR | Status: DC | PRN
  Administered 2020-04-25: 4 mL

## 2020-04-25 MED ORDER — KCL IN DEXTROSE-NACL 20-5-0.45 MEQ/L-%-% IV SOLN
INTRAVENOUS | Status: DC
  Filled 2020-04-25: qty 1000

## 2020-04-25 SURGICAL SUPPLY — 63 items
ATTRACTOMAT 16X20 MAGNETIC DRP (DRAPES) IMPLANT
BLADE CLIPPER SURG (BLADE) IMPLANT
BLADE SURG 10 STRL SS (BLADE) IMPLANT
BLADE SURG 15 STRL LF DISP TIS (BLADE) ×1 IMPLANT
BLADE SURG 15 STRL SS (BLADE) ×2
CANISTER SUCT 1200ML W/VALVE (MISCELLANEOUS) ×3 IMPLANT
CLIP VESOCCLUDE SM WIDE 6/CT (CLIP) IMPLANT
CORD BIPOLAR FORCEPS 12FT (ELECTRODE) ×3 IMPLANT
COVER BACK TABLE 60X90IN (DRAPES) ×3 IMPLANT
COVER MAYO STAND STRL (DRAPES) ×3 IMPLANT
COVER WAND RF STERILE (DRAPES) IMPLANT
DECANTER SPIKE VIAL GLASS SM (MISCELLANEOUS) ×3 IMPLANT
DERMABOND ADVANCED (GAUZE/BANDAGES/DRESSINGS) ×2
DERMABOND ADVANCED .7 DNX12 (GAUZE/BANDAGES/DRESSINGS) ×1 IMPLANT
DRAIN CHANNEL 10F 3/8 F FF (DRAIN) ×3 IMPLANT
DRAIN CHANNEL 7F FF FLAT (WOUND CARE) IMPLANT
DRAPE U-SHAPE 76X120 STRL (DRAPES) ×3 IMPLANT
ELECT COATED BLADE 2.86 ST (ELECTRODE) ×3 IMPLANT
ELECT REM PT RETURN 9FT ADLT (ELECTROSURGICAL) ×3
ELECTRODE REM PT RTRN 9FT ADLT (ELECTROSURGICAL) ×1 IMPLANT
EVACUATOR SILICONE 100CC (DRAIN) ×3 IMPLANT
FORCEPS BIPOLAR SPETZLER 8 1.0 (NEUROSURGERY SUPPLIES) ×3 IMPLANT
GAUZE 4X4 16PLY RFD (DISPOSABLE) ×3 IMPLANT
GAUZE SPONGE 4X4 12PLY STRL LF (GAUZE/BANDAGES/DRESSINGS) IMPLANT
GLOVE BIO SURGEON STRL SZ 6.5 (GLOVE) IMPLANT
GLOVE BIO SURGEON STRL SZ7.5 (GLOVE) ×3 IMPLANT
GLOVE BIO SURGEONS STRL SZ 6.5 (GLOVE)
GLOVE BIOGEL M 6.5 STRL (GLOVE) ×6 IMPLANT
GLOVE BIOGEL PI IND STRL 7.0 (GLOVE) ×2 IMPLANT
GLOVE BIOGEL PI INDICATOR 7.0 (GLOVE) ×4
GLOVE ECLIPSE 6.5 STRL STRAW (GLOVE) ×3 IMPLANT
GOWN STRL REUS W/ TWL LRG LVL3 (GOWN DISPOSABLE) ×4 IMPLANT
GOWN STRL REUS W/TWL LRG LVL3 (GOWN DISPOSABLE) ×8
HEMOSTAT SURGICEL 2X14 (HEMOSTASIS) IMPLANT
NEEDLE HYPO 25X1 1.5 SAFETY (NEEDLE) ×3 IMPLANT
NS IRRIG 1000ML POUR BTL (IV SOLUTION) ×3 IMPLANT
PACK BASIN DAY SURGERY FS (CUSTOM PROCEDURE TRAY) ×3 IMPLANT
PENCIL SMOKE EVACUATOR (MISCELLANEOUS) ×3 IMPLANT
PIN SAFETY STERILE (MISCELLANEOUS) ×3 IMPLANT
PROBE NERVBE PRASS .33 (MISCELLANEOUS) IMPLANT
SHEARS HARMONIC 9CM CVD (BLADE) ×3 IMPLANT
SLEEVE SCD COMPRESS KNEE MED (MISCELLANEOUS) ×3 IMPLANT
SPONGE INTESTINAL PEANUT (DISPOSABLE) ×3 IMPLANT
STAPLER VISISTAT 35W (STAPLE) IMPLANT
SUT ETHILON 3 0 PS 1 (SUTURE) ×3 IMPLANT
SUT PROLENE 5 0 P 3 (SUTURE) IMPLANT
SUT SILK 2 0 SH (SUTURE) ×3 IMPLANT
SUT SILK 2 0 TIES 17X18 (SUTURE)
SUT SILK 2-0 18XBRD TIE BLK (SUTURE) IMPLANT
SUT SILK 3 0 TIES 17X18 (SUTURE) ×4
SUT SILK 3-0 18XBRD TIE BLK (SUTURE) ×2 IMPLANT
SUT VIC AB 3-0 FS2 27 (SUTURE) ×3 IMPLANT
SUT VICRYL 4-0 PS2 18IN ABS (SUTURE) ×6 IMPLANT
SYR BULB EAR ULCER 3OZ GRN STR (SYRINGE) ×3 IMPLANT
SYR CONTROL 10ML LL (SYRINGE) ×3 IMPLANT
TOWEL GREEN STERILE FF (TOWEL DISPOSABLE) ×6 IMPLANT
TRAY DSU PREP LF (CUSTOM PROCEDURE TRAY) ×3 IMPLANT
TUBE CONNECTING 20'X1/4 (TUBING) ×1
TUBE CONNECTING 20X1/4 (TUBING) ×2 IMPLANT
TUBE ENDOTRAC NIMS EMG 6MM (MISCELLANEOUS) IMPLANT
TUBE ENDOTRAC NIMS EMG 7MM (MISCELLANEOUS) ×3 IMPLANT
TUBE ENDOTRAC NIMS EMG 8MM (MISCELLANEOUS) IMPLANT
TUBE ENDOTRAC NIMS EMG 9MM (MISCELLANEOUS) IMPLANT

## 2020-04-25 NOTE — Op Note (Signed)
DATE OF PROCEDURE:  04/25/2020                              OPERATIVE REPORT  SURGEON:  Leta Baptist, MD  PREOPERATIVE DIAGNOSES: 1. Left parathyroid adenoma   POSTOPERATIVE DIAGNOSES: 1. Left parathyroid adenoma   PROCEDURE PERFORMED:  Left parathyroidectomy   ANESTHESIA:  General endotracheal tube anesthesia.   COMPLICATIONS:  None.   ESTIMATED BLOOD LOSS: 50 ml   INDICATION FOR PROCEDURE: Adrianna Dudas is a 34 y.o. female with a history of a large left parathyroid adenoma and hypercalcemia.  The patient was originally diagnosed in 2019.  However, her surgery was delayed due to the pandemic.  The patient complained that the size of the left neck mass has increased over the past year.  Her previous CT scan showed a 2.8 cm heterogeneous mass abutting the lower pole of the left thyroid lobe.  The nuclear medicine scan was consistent with parathyroid adenoma.  The patient also has a history of multiple kidney stones in the past.  Over the past few months, the large size of the mass has caused compressive symptoms.  She would like to have the parathyroid adenoma removed. Based on the above findings, the decision was made for the patient to undergo the above stated procedure. Likelihood of success in reducing symptoms was also discussed.  The risks, benefits, alternatives, and details of the procedure were discussed with the patient.  Questions were invited and answered.  Informed consent was obtained.   DESCRIPTION:  The patient was taken to the operating room and placed supine on the operating table.  General endotracheal tube anesthesia was administered by the anesthesiologist.  A nerve monitoring endotracheal tube was used.  The nerve monitoring system was functional throughout the case.   The patient was positioned and prepped and draped in the standard fashion for thyroid surgery.  1% lidocaine with 1 100,000 epinephrine was infiltrated at the planned site of incision.  A transverse lower neck  incision was made.  The incision was carried down to the level of the platysma muscles.  Superiorly based and inferiorly based subplatysmal flaps were elevated in the standard fashion.  The strap muscles were divided at midline, and retracted laterally, exposing a large 4cm left parathyroid adenoma.   Careful dissection was performed to free the mass from the surrounding soft tissue.  The left recurrent laryngeal nerve was identified and preserved.  The nerve was noted to be functional throughout the case. The entire lmass was resected and sent to the pathology department for permanent histologic identification.  A #10 JP drain was placed.  The strap muscles were reapproximated with 4-0 Vicryl sutures.  The incision was closed in layers with 4-0 Vicryl and Dermabond.   The care of the patient was turned over to the anesthesiologist.  The patient was awakened from anesthesia without difficulty.  The patient was extubated and transferred to the recovery room in good condition.   OPERATIVE FINDINGS:  A large 4cm left parathyroid adenoma.   SPECIMEN: Left parathyroid adenoma.   FOLLOWUP CARE:  The patient will be admitted for overnight observation.  Keishia Ground W Jocelyne Reinertsen 04/25/2020 11:19 AM

## 2020-04-25 NOTE — Transfer of Care (Signed)
Immediate Anesthesia Transfer of Care Note  Patient: Tonya Brennan  Procedure(s) Performed: LEFT PARATHYROIDECTOMY (Left Neck)  Patient Location: PACU  Anesthesia Type:General  Level of Consciousness: awake, alert  and oriented  Airway & Oxygen Therapy: Patient Spontanous Breathing and Patient connected to face mask oxygen  Post-op Assessment: Report given to RN and Post -op Vital signs reviewed and stable  Post vital signs: Reviewed and stable  Last Vitals:  Vitals Value Taken Time  BP    Temp    Pulse    Resp    SpO2      Last Pain:  Vitals:   04/25/20 0743  TempSrc: Oral  PainSc: 8          Complications: No complications documented.

## 2020-04-25 NOTE — Anesthesia Preprocedure Evaluation (Addendum)
Anesthesia Evaluation  Patient identified by MRN, date of birth, ID band Patient awake    Reviewed: Allergy & Precautions, NPO status , Patient's Chart, lab work & pertinent test results  Airway Mallampati: II  TM Distance: >3 FB Neck ROM: Full    Dental no notable dental hx. (+) Teeth Intact, Dental Advisory Given   Pulmonary neg pulmonary ROS, former smoker,    Pulmonary exam normal breath sounds clear to auscultation       Cardiovascular hypertension (newly diagnosed, not yet on anti-hypertensives), Normal cardiovascular exam Rhythm:Regular Rate:Normal     Neuro/Psych negative neurological ROS  negative psych ROS   GI/Hepatic negative GI ROS, (+)     substance abuse  marijuana use,   Endo/Other  negative endocrine ROS  Renal/GU Renal disease (h/o kidney stones)  negative genitourinary   Musculoskeletal negative musculoskeletal ROS (+)   Abdominal   Peds  Hematology negative hematology ROS (+)   Anesthesia Other Findings Parathyroid mass  Reproductive/Obstetrics                            Anesthesia Physical Anesthesia Plan  ASA: II  Anesthesia Plan: General   Post-op Pain Management:    Induction: Intravenous  PONV Risk Score and Plan: 3 and Midazolam, Dexamethasone and Ondansetron  Airway Management Planned: Oral ETT  Additional Equipment:   Intra-op Plan:   Post-operative Plan: Extubation in OR  Informed Consent: I have reviewed the patients History and Physical, chart, labs and discussed the procedure including the risks, benefits and alternatives for the proposed anesthesia with the patient or authorized representative who has indicated his/her understanding and acceptance.     Dental advisory given  Plan Discussed with: CRNA  Anesthesia Plan Comments:         Anesthesia Quick Evaluation

## 2020-04-25 NOTE — Discharge Instructions (Addendum)
Parathyroidectomy, Care After This sheet gives you information about how to care for yourself after your procedure. Your health care provider may also give you more specific instructions. If you have problems or questions, contact your health care provider. What can I expect after the procedure? After the procedure, it is common to have:  Mild pain in the neck or upper body, especially when swallowing.  A swollen neck.  A sore throat.  A weak or hoarse voice.  Slight tingling or numbness around your mouth, or in your fingers or toes. This may last for a day or two after surgery. This condition is caused by low levels of calcium. You may be given calcium supplements to treat it. Follow these instructions at home:  Medicines  Take over-the-counter and prescription medicines only as told by your health care provider.  Do not drive or use heavy machinery while taking prescription pain medicine.  Do not take medicines that contain aspirin and ibuprofen until your health care provider says that you can. These medicines can increase your risk of bleeding. Eating and drinking  Follow instructions from your health care provider about eating or drinking restrictions. You may need to have only liquids and soft foods for a day after the procedure.  To prevent or treat constipation while you are taking prescription pain medicine, your health care provider may recommend that you: ? Drink enough fluid to keep your urine pale yellow. ? Take over-the-counter or prescription medicines. ? Eat foods that are high in fiber, such as fresh fruits and vegetables, whole grains, and beans. ? Limit foods that are high in fat and processed sugars, such as fried and sweet foods. Incision care  Follow instructions from your health care provider about how to take care of your incision. Make sure you: ? Wash your hands with soap and water before you change your bandage (dressing). If soap and water are not  available, use hand sanitizer. ? Leave stitches (sutures), skin glue, or adhesive strips in place. These skin closures may need to stay in place for 2 weeks or longer. If adhesive strip edges start to loosen and curl up, you may trim the loose edges. Do not remove adhesive strips completely unless your health care provider tells you to do that.  Check your incision area every day for signs of infection. Check for: ? Redness, swelling, or pain. ? Fluid or blood. ? Warmth. ? Pus or a bad smell. Activity  For the first 7 days after the procedure or as instructed by your health care provider: ? Do not lift anything that is heavier than 20 lb. ? Do not jog, swim, or do other strenuous exercises. ? Do not play contact sports.  Avoid sitting for a long time without moving. Get up to take short walks every 1-2 hours. This is important to improve blood flow and breathing. Ask for help if you feel weak or unsteady.  Return to your normal activities as told by your health care provider. Ask your health care provider what activities are safe for you. General instructions  Do not use any products that contain nicotine or tobacco, such as cigarettes and e-cigarettes. These can delay healing after surgery. If you need help quitting, ask your health care provider.  Keep all follow-up visits as told by your health care provider. This is important. Your health care provider needs to monitor the calcium level in your blood to make sure that it does not become low. Contact a health care  provider if you:  Have a fever.  Have more redness, swelling, or pain around your incision area.  Have fluid or blood coming from your incision area.  Notice that your incision area feels warm to the touch.  Have pus or a bad smell coming from your incision area.  Have trouble talking.  Have nausea or vomiting for more than 2 days. Get help right away if you:  Have trouble breathing.  Have trouble  swallowing.  Develop a rash.  Develop a cough that gets worse.  Notice that your speech changes, or you have hoarseness that gets worse.  Develop numbness, tingling, or muscle spasms in the arms, hands, feet, or face. Summary  For a day or two after the procedure, you may have tingling or numbness around your mouth, or in your fingers or toes. Temporary hoarseness may also occur.  Follow instructions from your health care provider about how to take care of your incision. Watch for signs of infection.  Keep all follow-up visits as told by your health care provider. This is important. Your health care provider needs to monitor the calcium level in your blood to make sure that it does not become low.  Get help right away if you develop difficulty breathing, or numbness, tingling, or muscle spasms in the arms, hands, feet, or face. This information is not intended to replace advice given to you by your health care provider. Make sure you discuss any questions you have with your health care provider. Document Revised: 08/22/2017 Document Reviewed: 07/15/2017 Elsevier Patient Education  [place].    Tylenol given last at 0503am.  Hydrocodone-Acetaminophen given last at 0714am.

## 2020-04-25 NOTE — Anesthesia Procedure Notes (Signed)
Procedure Name: Intubation Performed by: Verita Lamb, CRNA Pre-anesthesia Checklist: Patient identified, Emergency Drugs available, Suction available and Patient being monitored Patient Re-evaluated:Patient Re-evaluated prior to induction Oxygen Delivery Method: Circle system utilized Preoxygenation: Pre-oxygenation with 100% oxygen Induction Type: IV induction Ventilation: Mask ventilation without difficulty Laryngoscope Size: Mac and 4 Grade View: Grade I Tube type: Oral Tube size: 7.0 mm Number of attempts: 1 Airway Equipment and Method: Stylet and Oral airway Placement Confirmation: ETT inserted through vocal cords under direct vision,  positive ETCO2 and breath sounds checked- equal and bilateral Secured at: 23 cm Tube secured with: Tape Dental Injury: Teeth and Oropharynx as per pre-operative assessment

## 2020-04-25 NOTE — H&P (Signed)
Cc: Parathyroid adenoma, throat pain and swelling  HPI: The patient is a 34 y/o female who presents today for evaluation of throat pain and swelling. The patient is seen in consultation requested by [facility]. The patient has a history of a parathyroid adenoma with hypercalcemia. This was diagnosed in 2019. She was supposed to have surgery but never had follow up secondary to the pandemic. The patient has noted an increase in the size of the mass with associated compressive symptoms. She had a neck CT recently which showed a 2.8 cm heterogeneous mass abutting the lower pole of the left thyroid lobe consistent with a parathyroid adenoma. The patient has not had her calcium level checked recently. She has had issues with multiple kidney stones in the past. The patient is otherwise healthy. No previous ENT surgery is noted.   The patient's review of systems (constitutional, eyes, ENT, cardiovascular, respiratory, GI, musculoskeletal, skin, neurologic, psychiatric, endocrine, hematologic, allergic) is noted in the ROS questionnaire.  It is reviewed with the patient.   Family health history: Diabetes.  Major events: Kidney stones.  Ongoing medical problems: Weight loss, hypertension, headaches.  Social history: The patient is single. She denies the use of tobacco, alcohol or illegal drugs.   Exam: General: Communicates without difficulty, well nourished, no acute distress. Head: Normocephalic, no evidence injury, no tenderness, facial buttresses intact without stepoff. Eyes: PERRL, EOMI. No scleral icterus, conjunctivae clear. Neuro: CN II exam reveals vision grossly intact.  No nystagmus at any point of gaze. Ears: Auricles well formed without lesions.  Ear canals are intact without mass or lesion.  No erythema or edema is appreciated.  The TMs are intact without fluid. Nose: External evaluation reveals normal support and skin without lesions.  Dorsum is intact.  Anterior rhinoscopy reveals healthy pink  mucosa over anterior aspect of inferior turbinates and intact septum.  No purulence noted. Oral:  Oral cavity and oropharynx are intact, symmetric, without erythema or edema.  Mucosa is moist without lesions. Neck: Full range of motion without pain.  There is no significant lymphadenopathy.  No masses palpable.  Mass along the left thyroid.  Parotid glands and submandibular glands equal bilaterally without mass.  Trachea is midline. Neuro:  CN 2-12 grossly intact. Gait normal. Vestibular: No nystagmus at any point of gaze. The cerebellar examination is unremarkable.   Procedure:  Flexible Fiberoptic Laryngoscopy -- Risks, benefits, and alternatives of flexible endoscopy were explained to the patient.  Specific mention was made of the risk of throat numbness with difficulty swallowing, possible bleeding from the nose and mouth, and pain from the procedure.  The patient gave oral consent to proceed.  The nasal cavities were decongested and anesthetised with a combination of oxymetazoline and 4% lidocaine solution.  The flexible scope was inserted into the right nasal cavity and advanced towards the nasopharynx.  Visualized mucosa over the turbinates and septum were as described above.  The nasopharynx was clear.  Oropharyngeal walls were symmetric and mobile without lesion, mass, or edema.  Hypopharynx was also without  lesion or edema.  Larynx was mobile without lesions. Supraglottic structures were free of edema, mass, and asymmetry.  True vocal folds were white without mass or lesion.  Base of tongue was within normal limits.  The patient tolerated the procedure well.   Assessment 1. The patient has a 2.8 cm left parathyroid adenoma resulting in compressive symptoms.  2. Vocal cords are mobile.  No other suspicious mass or lesion is noted on today's  fiberoptic laryngoscopy exam.   Plan Print visit summary 1. The physical exam, laryngoscopy, and CT findings are reviewed with the patient.  2. In light of  her compressive symptoms, the patient would benefit from surgical excision of the mass. The risks, benefits, alternatives, and details of the procedure are reviewed with the patient. Questions are invited and answered. 3. The patient is interested in proceeding with the procedure.  We will schedule the procedure in accordance with the patient's schedule.  4. Recommend PTH, Total Calcium, TSH, and Free T4 prior to surgery.

## 2020-04-25 NOTE — Anesthesia Postprocedure Evaluation (Signed)
Anesthesia Post Note  Patient: Tonya Brennan  Procedure(s) Performed: LEFT PARATHYROIDECTOMY (Left Neck)     Patient location during evaluation: PACU Anesthesia Type: General Level of consciousness: awake and alert Pain management: pain level controlled Vital Signs Assessment: post-procedure vital signs reviewed and stable Respiratory status: spontaneous breathing, nonlabored ventilation and respiratory function stable Cardiovascular status: blood pressure returned to baseline and stable Postop Assessment: no apparent nausea or vomiting Anesthetic complications: no   No complications documented.  Last Vitals:  Vitals:   04/25/20 1245 04/25/20 1300  BP: (!) 131/101 (!) 156/98  Pulse:  69  Resp: 18 16  Temp:  (!) 26.6 C  SpO2:  98%    Last Pain:  Vitals:   04/25/20 1300  TempSrc:   PainSc: 2                  Lynda Rainwater

## 2020-04-26 ENCOUNTER — Encounter (HOSPITAL_BASED_OUTPATIENT_CLINIC_OR_DEPARTMENT_OTHER): Payer: Self-pay | Admitting: Otolaryngology

## 2020-04-26 LAB — SURGICAL PATHOLOGY

## 2020-04-26 LAB — PTH, INTACT AND CALCIUM
Calcium, Total (PTH): 12.4 mg/dL — ABNORMAL HIGH (ref 8.7–10.2)
Calcium, Total (PTH): 12.1 mg/dL — ABNORMAL HIGH (ref 8.7–10.2)
PTH: 276 pg/mL — ABNORMAL HIGH (ref 15–65)
PTH: 8 pg/mL — ABNORMAL LOW (ref 15–65)

## 2020-04-26 MED ORDER — ACETAMINOPHEN 325 MG PO TABS
650.0000 mg | ORAL_TABLET | Freq: Four times a day (QID) | ORAL | Status: DC | PRN
  Administered 2020-04-26: 650 mg via ORAL
  Filled 2020-04-26: qty 2

## 2020-04-26 NOTE — Discharge Summary (Signed)
Physician Discharge Summary  Patient ID: Tonya Brennan MRN: [id number] DOB/AGE: [birth year] 34 y.o.  Admit date: 04/25/2020 Discharge date: 04/26/2020  Admission Diagnoses: Left parathyroid adenoma  Discharge Diagnoses: Left parathyroid adenoma Active Problems:   Parathyroid adenoma   Discharged Condition: good  Hospital Course: Pt had an uneventful overnight stay. Pt tolerated po well. No bleeding. No stridor. Voice is strong  Consults: None  Significant Diagnostic Studies: None  Treatments: surgery: Left parathyroidectomy  Discharge Exam: Blood pressure (!) 141/103, pulse 80, temperature 97.9 F (36.6 C), resp. rate 18, height 5\' 6"  (1.676 m), weight 75.8 kg, last menstrual period 03/21/2020, SpO2 98 %. Incision/Wound:c/d/i Voice is strong.  Disposition: Discharge disposition: 01-Home or Self Care       Discharge Instructions    Activity as tolerated - No restrictions   Complete by: As directed    Diet general   Complete by: As directed    No wound care   Complete by: As directed      Allergies as of 04/26/2020      Reactions   Percocet [oxycodone-acetaminophen] Itching, Nausea And Vomiting   Theraflu Max-d Cold & Flu [pseudoephedrine-[id number]] Nausea And Vomiting      Medication List    TAKE these medications   amoxicillin 875 MG tablet Commonly known as: AMOXIL Take 1 tablet (875 mg total) by mouth 2 (two) times daily for 3 days.   HYDROcodone-acetaminophen 5-325 MG tablet Commonly known as: NORCO/VICODIN Take 1 tablet by mouth every 4 (four) hours as needed for up to 3 days for severe pain.   lidocaine 2 % solution Commonly known as: XYLOCAINE Use as directed 15 mLs in the mouth or throat as needed for mouth pain.   methocarbamol 500 MG tablet Commonly known as: ROBAXIN Take 1 tablet (500 mg total) by mouth 2 (two) times daily as needed for muscle spasms.       Follow-up Information    Tonya Baptist, MD On 05/04/2020.   Specialty: Otolaryngology Why:  Tonya Brennan information: [place] [address] [phone]               Signed: Burley Saver 04/26/2020, 8:00 AM

## 2020-05-04 DIAGNOSIS — R87619 Unspecified abnormal cytological findings in specimens from cervix uteri: Secondary | ICD-10-CM | POA: Insufficient documentation

## 2020-05-04 DIAGNOSIS — I10 Essential (primary) hypertension: Secondary | ICD-10-CM | POA: Insufficient documentation

## 2020-05-31 NOTE — H&P (Signed)
Tonya Brennan 34yo G0 preop visit at [facility] 05/30/2020 for laparoscopic ovarian cystectomy 06/13/2020 due to right ovarian dermoid cyst that was found on CT scan.  Cyst found incidentally in 2020, at that time she was treated for kidney stone. Delayed following up due to [place] pandemic. Newly diagnosed HTN, pt. taking medication for high blood pressure but did not take it today.  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  Reports occasional pain on right side. No issues with BMs. Menses - last "real" period was in June 2021, had some dark brown discharge in July, previously monthly periods.  CT scan 06/2019 Narrative & Impression CLINICAL DATA: Flank pain. EXAM: CT ABDOMEN AND PELVIS WITHOUT CONTRAST TECHNIQUE: Multidetector CT imaging of the abdomen and pelvis was performed following the standard protocol without IV contrast. COMPARISON: 06/01/2019. FINDINGS: Lower chest: No acute abnormality. Hepatobiliary: No focal liver abnormality is seen. No gallstones, gallbladder wall thickening, or biliary dilatation. Pancreas: Unremarkable. No pancreatic ductal dilatation or surrounding inflammatory changes. Spleen: Normal in size without focal abnormality. Adrenals/Urinary Tract: Redemonstration of the numerous right-sided renal calculi. Right kidney is scarred and mildly atrophic. There is a tiny punctate calcification within the proximal right ureter near the ureteropelvic junction (series 2, image 49). Mild right-sided hydronephrosis. Unchanged 9 mm calcification within the midpole of the left kidney. No left-sided hydronephrosis. Urinary bladder is incompletely distended but appears mildly thickened. Adrenal glands unremarkable. Stomach/Bowel: Stomach is within normal limits. Appendix appears normal. No evidence of bowel wall thickening, distention, or inflammatory changes. Vascular/Lymphatic: No significant vascular findings are present. No enlarged  abdominal or pelvic lymph nodes. Reproductive: Right ovarian dermoid measuring approximately 5.8 x 5.3 cm containing fat, calcium, and soft tissue density (previously measured 5.4 x 4.8 cm on 08/03/2018). Uterus and left adnexa are unremarkable. Other: No ascites. Musculoskeletal: Sequela of chronic bilateral sacroiliitis. No acute osseous findings. IMPRESSION: 1. Multiple bilateral renal calculi. A tiny punctate stone is seen within the proximal right ureter near the UPJ causing moderate hydronephrosis. 2. Slight interval enlargement of right ovarian dermoid currently measuring up to 5.8 cm (previously 5.4 cm on 08/03/2018). 3. Sequela of chronic bilateral sacroiliitis. Electronically Signed By: Davina Poke M.D. On: 07/13/2019 12:06    Past Medical History:  Diagnosis Date  . History of kidney stones   . Kidney stones   . Thyroid disease     Past Surgical History:  Procedure Laterality Date  . CYSTOSCOPY W/ URETERAL STENT PLACEMENT Right 06/03/2019   Procedure: CYSTOSCOPY WITH RETROGRADE PYELOGRAM/URETERAL STENT PLACEMENT/STONE REMOVAL WITH BASKET;  Surgeon: Cleon Gustin, MD;  Location: [facility];  Service: Urology;  Laterality: Right;  . IR NEPHROSTOGRAM RIGHT THRU EXISTING ACCESS  08/14/2018  . IR URETERAL STENT PLACEMENT EXISTING ACCESS RIGHT  08/19/2018  . IR URETERAL STENT RIGHT NEW ACCESS W/O SEP NEPHROSTOMY CATH  08/10/2018  . NEPHROLITHOTOMY Right 08/11/2018   Procedure: NEPHROLITHOTOMY PERCUTANEOUS;  Surgeon: Irine Seal, MD;  Location: [facility];  Service: Urology;  Laterality: Right;  . PARATHYROIDECTOMY Left 04/25/2020   Procedure: LEFT PARATHYROIDECTOMY;  Surgeon: Leta Baptist, MD;  Location: [place];  Service: ENT;  Laterality: Left;    No family history on file.  Social History:  reports that she has quit smoking. She has never used smokeless tobacco. She reports current alcohol use. She reports current drug use. Drug:  Marijuana.  Allergies:  Allergies  Allergen Reactions  . Percocet [Oxycodone-Acetaminophen] Itching and Nausea And Vomiting  . Theraflu Max-D Cold & Flu [Pseudoephedrine-Dm-Gg-Apap]  Nausea And Vomiting    No medications prior to admission.    Review of Systems per HPI all other pertinent ROS Neg  There were no vitals taken for this visit. Physical Exam Chaperone Chaperone: present  Constitutional *General Appearance: healthy-appearing  Head Head: normocephalic  Neck *Thyroid: (Healing well from recent parathyroidectomy)  Cardiovascular *Auscultation: RRR, no murmur  Lungs *Respiratory Effort: no accessory muscle usage *Auscultation: clear to auscultation  *Breast Bilateral: no skin changes, nipple appearance: normal, no abnormal nipple secretions, no tenderness, no masses palpable Right Breast: normal Left Breast: normal  Abdomen *Inspection/Palpation/Auscultation: non-distended, soft  Female Genitalia Vulva: no masses, no atrophy, no lesions Mons: normal Labia Majora: normal Labia Minora: normal Introitus: normal *Vagina: normal, no discharge *Cervix: grossly normal, no lesions, no discharge *Uterus: normal size *Adnexa/Parametria: no tenderness, adnexal mass right (mobile)  Assessment/Plan: 34yo here for preop visit for laparoscopic cystectomy due to ovarian dermoid cyst -The nature of the procedure was discussed with the patient in detail. An informed discussion was held regarding the risks and benefits of surgical intervention. Specifically, the patient was apprised of risks of pain, bleeding requiring blood transfusion, infection requiring antibiotics, injury to nearby organs (bowel, bladder, nerves, blood vessels, ureter), need for laparotomy to complete the operation, or failure to achieve desired results. She was informed of the low but real risk of these complications, and understands that the alternative is no surgery. An opportunity to ask questions  was provided, and all questions were answered to the patient's satisfaction. Patient expresses understanding of these issues, and agrees to proceed with the plan outlined above. The expected post-operative recovery course was discussed with the patient, and post-operative instructions were reviewed. -Discussed ERAS, plan for discharge same day -Recommend COVID vaccine -Amenorrhea: LMP 02/2020, normally menses q41m, negative UPT today. F/u postop -Reviewed pap smear (LSIL, HPV Neg 04/2020), repeat 04/2021  Carlyle Achenbach K Taam-Akelman 05/31/2020, 2:53 PM

## 2020-06-07 ENCOUNTER — Encounter (HOSPITAL_BASED_OUTPATIENT_CLINIC_OR_DEPARTMENT_OTHER): Payer: Self-pay | Admitting: Obstetrics & Gynecology

## 2020-06-07 ENCOUNTER — Other Ambulatory Visit: Payer: Self-pay

## 2020-06-07 NOTE — Progress Notes (Addendum)
Spoke w/ via phone for pre-op interview---PT Lab needs dos---- NEEDS URINE PREG DAY OF SURGERY,  HAS LAB APPT 06-09-20 AT 1130 FOR CBC, BMET AND TYPE AND SCREEN  , EKG            Lab results------NONE COVID test ------06-09-20 AT 1300 Arrive at ------530 AM 06-13-20- NPO after MN NO Solid Food.  Clear liquids from MN until---430 AM THEN NPO Medications to take morning of surgery -----NONE Diabetic medication -----N/A Patient Special Instructions -----PT GIVEN OVERNIGHT STAY INSTRUCTIONS Pre-Op special Istructions -----NONE Patient verbalized understanding of instructions that were given at this phone interview. Patient denies shortness of breath, chest pain, fever, cough at this phone interview.

## 2020-06-08 ENCOUNTER — Other Ambulatory Visit (HOSPITAL_COMMUNITY): Payer: Self-pay

## 2020-06-09 ENCOUNTER — Other Ambulatory Visit (HOSPITAL_COMMUNITY)
Admission: RE | Admit: 2020-06-09 | Discharge: 2020-06-09 | Disposition: A | Discharge status: Home / Self Care | Payer: HRSA Program | Source: Ambulatory Visit | Attending: Obstetrics & Gynecology | Admitting: Obstetrics & Gynecology

## 2020-06-09 ENCOUNTER — Encounter (HOSPITAL_COMMUNITY)
Admission: RE | Admit: 2020-06-09 | Discharge: 2020-06-09 | Disposition: A | Discharge status: Home / Self Care | Payer: Self-pay | Source: Ambulatory Visit | Attending: Obstetrics & Gynecology | Admitting: Obstetrics & Gynecology

## 2020-06-09 ENCOUNTER — Other Ambulatory Visit: Payer: Self-pay

## 2020-06-09 DIAGNOSIS — I10 Essential (primary) hypertension: Secondary | ICD-10-CM | POA: Insufficient documentation

## 2020-06-09 DIAGNOSIS — Z01812 Encounter for preprocedural laboratory examination: Secondary | ICD-10-CM | POA: Insufficient documentation

## 2020-06-09 DIAGNOSIS — Z20822 Contact with and (suspected) exposure to covid-19: Secondary | ICD-10-CM | POA: Insufficient documentation

## 2020-06-09 DIAGNOSIS — Z01818 Encounter for other preprocedural examination: Secondary | ICD-10-CM | POA: Insufficient documentation

## 2020-06-09 LAB — BASIC METABOLIC PANEL
Anion gap: 7 (ref 5–15)
BUN: 11 mg/dL (ref 6–20)
CO2: 26 mmol/L (ref 22–32)
Calcium: 9.4 mg/dL (ref 8.9–10.3)
Chloride: 105 mmol/L (ref 98–111)
Creatinine, Ser: 0.84 mg/dL (ref 0.44–1.00)
GFR calc Af Amer: >60 mL/min (ref >60)
GFR calc non Af Amer: >60 mL/min (ref >60)
Glucose, Bld: 97 mg/dL (ref 70–99)
Potassium: 4.4 mmol/L (ref 3.5–5.1)
Sodium: 138 mmol/L (ref 135–145)

## 2020-06-09 LAB — CBC
HCT: 43.3 % (ref 36.0–46.0)
Hemoglobin: 13.7 g/dL (ref 12.0–15.0)
MCH: 29.7 pg (ref 26.0–34.0)
MCHC: 31.6 g/dL (ref 30.0–36.0)
MCV: 93.7 fL (ref 80.0–100.0)
Platelets: 268 10*3/uL (ref 150–400)
RBC: 4.62 MIL/uL (ref 3.87–5.11)
RDW: 12.7 % (ref 11.5–15.5)
WBC: 7.6 10*3/uL (ref 4.0–10.5)
nRBC: 0 % (ref 0.0–0.2)

## 2020-06-09 NOTE — Progress Notes (Signed)
Pt came today for lab work and had vital signs done. Pt bp 137/ 110.  Pt stated had been taking her bp medication because had not refilled it yet and she did not know name.  Pt called her pharmacy while still here and had rx refilled to pick it up today, it is norvasc 10mg  , stated takes in am.  Advised pt to pick up bp med today and start taking today, everyday , and take morning of surgery.  Pt verbalized understanding if bp is high morning of surgery probable cancel surgery by anesthesia.  Reviewed by anesthesia, Konrad Felix PA,  Agreed with plan.

## 2020-06-10 LAB — SARS CORONAVIRUS 2 (TAT 6-24 HRS): SARS Coronavirus 2: NEGATIVE

## 2020-06-12 NOTE — Anesthesia Preprocedure Evaluation (Addendum)
Anesthesia Evaluation  Patient identified by MRN, date of birth, ID band Patient awake    Reviewed: Allergy & Precautions, NPO status , Patient's Chart, lab work & pertinent test results  Airway Mallampati: II  TM Distance: >3 FB Neck ROM: Full    Dental  (+) Missing, Chipped,    Pulmonary former smoker,    Pulmonary exam normal breath sounds clear to auscultation       Cardiovascular hypertension, Pt. on medications Normal cardiovascular exam Rhythm:Regular Rate:Normal     Neuro/Psych negative neurological ROS     GI/Hepatic negative GI ROS, Neg liver ROS,   Endo/Other  negative endocrine ROS  Renal/GU negative Renal ROS     Musculoskeletal negative musculoskeletal ROS (+)   Abdominal   Peds  Hematology negative hematology ROS (+)   Anesthesia Other Findings   Reproductive/Obstetrics right ovarian dermoid cyst                           Anesthesia Physical Anesthesia Plan  ASA: II  Anesthesia Plan: General   Post-op Pain Management:    Induction: Intravenous  PONV Risk Score and Plan: 4 or greater and Scopolamine patch - Pre-op, Midazolam, Dexamethasone and Ondansetron  Airway Management Planned: Oral ETT  Additional Equipment:   Intra-op Plan:   Post-operative Plan: Extubation in OR  Informed Consent: I have reviewed the patients History and Physical, chart, labs and discussed the procedure including the risks, benefits and alternatives for the proposed anesthesia with the patient or authorized representative who has indicated his/her understanding and acceptance.       Plan Discussed with: CRNA  Anesthesia Plan Comments:        Anesthesia Quick Evaluation

## 2020-06-13 ENCOUNTER — Ambulatory Visit (HOSPITAL_BASED_OUTPATIENT_CLINIC_OR_DEPARTMENT_OTHER): Payer: Self-pay | Admitting: Anesthesiology

## 2020-06-13 ENCOUNTER — Encounter (HOSPITAL_BASED_OUTPATIENT_CLINIC_OR_DEPARTMENT_OTHER): Payer: Self-pay | Admitting: Obstetrics & Gynecology

## 2020-06-13 ENCOUNTER — Ambulatory Visit (HOSPITAL_BASED_OUTPATIENT_CLINIC_OR_DEPARTMENT_OTHER)
Admission: RE | Admit: 2020-06-13 | Discharge: 2020-06-13 | Disposition: A | Discharge status: Home / Self Care | Payer: Self-pay | Attending: Obstetrics & Gynecology | Admitting: Obstetrics & Gynecology

## 2020-06-13 ENCOUNTER — Encounter (HOSPITAL_BASED_OUTPATIENT_CLINIC_OR_DEPARTMENT_OTHER)
Admission: RE | Disposition: A | Discharge status: Home / Self Care | Payer: Self-pay | Source: Home / Self Care | Attending: Obstetrics & Gynecology

## 2020-06-13 DIAGNOSIS — Z885 Allergy status to narcotic agent status: Secondary | ICD-10-CM | POA: Insufficient documentation

## 2020-06-13 DIAGNOSIS — Z87891 Personal history of nicotine dependence: Secondary | ICD-10-CM | POA: Insufficient documentation

## 2020-06-13 DIAGNOSIS — N132 Hydronephrosis with renal and ureteral calculous obstruction: Secondary | ICD-10-CM | POA: Insufficient documentation

## 2020-06-13 DIAGNOSIS — E892 Postprocedural hypoparathyroidism: Secondary | ICD-10-CM | POA: Insufficient documentation

## 2020-06-13 DIAGNOSIS — Z888 Allergy status to other drugs, medicaments and biological substances status: Secondary | ICD-10-CM | POA: Insufficient documentation

## 2020-06-13 DIAGNOSIS — I10 Essential (primary) hypertension: Secondary | ICD-10-CM | POA: Insufficient documentation

## 2020-06-13 DIAGNOSIS — M461 Sacroiliitis, not elsewhere classified: Secondary | ICD-10-CM | POA: Insufficient documentation

## 2020-06-13 DIAGNOSIS — D270 Benign neoplasm of right ovary: Secondary | ICD-10-CM | POA: Insufficient documentation

## 2020-06-13 DIAGNOSIS — N912 Amenorrhea, unspecified: Secondary | ICD-10-CM | POA: Insufficient documentation

## 2020-06-13 DIAGNOSIS — N83201 Unspecified ovarian cyst, right side: Secondary | ICD-10-CM

## 2020-06-13 DIAGNOSIS — Z791 Long term (current) use of non-steroidal anti-inflammatories (NSAID): Secondary | ICD-10-CM | POA: Insufficient documentation

## 2020-06-13 DIAGNOSIS — Z79899 Other long term (current) drug therapy: Secondary | ICD-10-CM | POA: Insufficient documentation

## 2020-06-13 HISTORY — PX: LAPAROSCOPIC OVARIAN CYSTECTOMY: SHX6248

## 2020-06-13 HISTORY — DX: Essential (primary) hypertension: I10

## 2020-06-13 HISTORY — DX: Unspecified ovarian cyst, right side: N83.201

## 2020-06-13 LAB — TYPE AND SCREEN
ABO/RH(D): O POS
Antibody Screen: NEGATIVE

## 2020-06-13 LAB — POCT PREGNANCY, URINE: Preg Test, Ur: NEGATIVE

## 2020-06-13 SURGERY — EXCISION, CYST, OVARY, LAPAROSCOPIC
Anesthesia: General | Site: Abdomen | Laterality: Right

## 2020-06-13 MED ORDER — FENTANYL CITRATE (PF) 100 MCG/2ML IJ SOLN
INTRAMUSCULAR | Status: DC | PRN
  Administered 2020-06-13: 25 ug via INTRAVENOUS
  Administered 2020-06-13 (×2): 50 ug via INTRAVENOUS
  Administered 2020-06-13: 25 ug via INTRAVENOUS
  Administered 2020-06-13 (×2): 50 ug via INTRAVENOUS

## 2020-06-13 MED ORDER — PROPOFOL 10 MG/ML IV BOLUS
INTRAVENOUS | Status: DC | PRN
  Administered 2020-06-13: 150 mg via INTRAVENOUS

## 2020-06-13 MED ORDER — PROMETHAZINE HCL 25 MG/ML IJ SOLN: 6.2500 mg | INTRAMUSCULAR | Status: DC | PRN

## 2020-06-13 MED ORDER — PROPOFOL 10 MG/ML IV BOLUS
INTRAVENOUS | Status: AC
  Filled 2020-06-13: qty 40

## 2020-06-13 MED ORDER — ACETAMINOPHEN 500 MG PO TABS
ORAL_TABLET | ORAL | Status: AC
  Filled 2020-06-13: qty 2

## 2020-06-13 MED ORDER — FENTANYL CITRATE (PF) 100 MCG/2ML IJ SOLN: 25.0000 ug | INTRAMUSCULAR | Status: DC | PRN

## 2020-06-13 MED ORDER — GABAPENTIN 300 MG PO CAPS
ORAL_CAPSULE | ORAL | Status: AC
  Filled 2020-06-13: qty 1

## 2020-06-13 MED ORDER — FENTANYL CITRATE (PF) 250 MCG/5ML IJ SOLN
INTRAMUSCULAR | Status: AC
  Filled 2020-06-13: qty 5

## 2020-06-13 MED ORDER — ONDANSETRON HCL 4 MG/2ML IJ SOLN
INTRAMUSCULAR | Status: DC | PRN
  Administered 2020-06-13: 4 mg via INTRAVENOUS

## 2020-06-13 MED ORDER — LIDOCAINE 2% (20 MG/ML) 5 ML SYRINGE
INTRAMUSCULAR | Status: DC | PRN
  Administered 2020-06-13: 1.5 mg/kg/h via INTRAVENOUS

## 2020-06-13 MED ORDER — DEXAMETHASONE SODIUM PHOSPHATE 10 MG/ML IJ SOLN
INTRAMUSCULAR | Status: DC | PRN
  Administered 2020-06-13: 10 mg via INTRAVENOUS

## 2020-06-13 MED ORDER — ESMOLOL HCL 100 MG/10ML IV SOLN
INTRAVENOUS | Status: DC | PRN
  Administered 2020-06-13 (×2): 10 mg via INTRAVENOUS

## 2020-06-13 MED ORDER — IBUPROFEN 800 MG PO TABS: 800.0000 mg | ORAL_TABLET | Freq: Three times a day (TID) | ORAL | 0 refills | Status: DC | PRN

## 2020-06-13 MED ORDER — BUPIVACAINE HCL (PF) 0.25 % IJ SOLN
INTRAMUSCULAR | Status: DC | PRN
  Administered 2020-06-13: 14 mL

## 2020-06-13 MED ORDER — SODIUM CHLORIDE 0.9 % IR SOLN
Status: DC | PRN
  Administered 2020-06-13: 3000 mL

## 2020-06-13 MED ORDER — HYDROCODONE-ACETAMINOPHEN 5-325 MG PO TABS: 1.0000 | ORAL_TABLET | Freq: Four times a day (QID) | ORAL | 0 refills | Status: DC | PRN

## 2020-06-13 MED ORDER — LACTATED RINGERS IV SOLN: INTRAVENOUS | Status: DC

## 2020-06-13 MED ORDER — ROCURONIUM BROMIDE 10 MG/ML (PF) SYRINGE
PREFILLED_SYRINGE | INTRAVENOUS | Status: AC
  Filled 2020-06-13: qty 10

## 2020-06-13 MED ORDER — ONDANSETRON HCL 4 MG/2ML IJ SOLN
INTRAMUSCULAR | Status: AC
  Filled 2020-06-13: qty 2

## 2020-06-13 MED ORDER — GABAPENTIN 300 MG PO CAPS
300.0000 mg | ORAL_CAPSULE | ORAL | Status: AC
  Administered 2020-06-13: 300 mg via ORAL

## 2020-06-13 MED ORDER — SUGAMMADEX SODIUM 200 MG/2ML IV SOLN
INTRAVENOUS | Status: DC | PRN
  Administered 2020-06-13: 250 mg via INTRAVENOUS

## 2020-06-13 MED ORDER — ACETAMINOPHEN 500 MG PO TABS
1000.0000 mg | ORAL_TABLET | ORAL | Status: AC
  Administered 2020-06-13: 1000 mg via ORAL

## 2020-06-13 MED ORDER — KETOROLAC TROMETHAMINE 15 MG/ML IJ SOLN
15.0000 mg | INTRAMUSCULAR | Status: AC
  Administered 2020-06-13: 30 mg via INTRAVENOUS

## 2020-06-13 MED ORDER — SODIUM CHLORIDE (PF) 0.9 % IJ SOLN
INTRAMUSCULAR | Status: AC
  Filled 2020-06-13: qty 100

## 2020-06-13 MED ORDER — VASOPRESSIN 20 UNIT/ML IV SOLN
INTRAVENOUS | Status: DC | PRN
  Administered 2020-06-13: 6 mL via INTRAMUSCULAR

## 2020-06-13 MED ORDER — SCOPOLAMINE 1 MG/3DAYS TD PT72
MEDICATED_PATCH | TRANSDERMAL | Status: AC
  Filled 2020-06-13: qty 1

## 2020-06-13 MED ORDER — SCOPOLAMINE 1 MG/3DAYS TD PT72
1.0000 | MEDICATED_PATCH | TRANSDERMAL | Status: DC
  Administered 2020-06-13: 1.5 mg via TRANSDERMAL

## 2020-06-13 MED ORDER — POVIDONE-IODINE 10 % EX SWAB: 2.0000 "application " | Freq: Once | CUTANEOUS | Status: DC

## 2020-06-13 MED ORDER — BUPIVACAINE HCL (PF) 0.25 % IJ SOLN
INTRAMUSCULAR | Status: AC
  Filled 2020-06-13: qty 30

## 2020-06-13 MED ORDER — ROCURONIUM BROMIDE 10 MG/ML (PF) SYRINGE
PREFILLED_SYRINGE | INTRAVENOUS | Status: DC | PRN
  Administered 2020-06-13: 50 mg via INTRAVENOUS

## 2020-06-13 MED ORDER — LABETALOL HCL 5 MG/ML IV SOLN
INTRAVENOUS | Status: DC | PRN
  Administered 2020-06-13: 5 mg via INTRAVENOUS

## 2020-06-13 MED ORDER — MIDAZOLAM HCL 2 MG/2ML IJ SOLN
INTRAMUSCULAR | Status: DC | PRN
  Administered 2020-06-13: 2 mg via INTRAVENOUS

## 2020-06-13 MED ORDER — LIDOCAINE 2% (20 MG/ML) 5 ML SYRINGE
INTRAMUSCULAR | Status: AC
  Filled 2020-06-13: qty 5

## 2020-06-13 MED ORDER — KETOROLAC TROMETHAMINE 30 MG/ML IJ SOLN
INTRAMUSCULAR | Status: AC
  Filled 2020-06-13: qty 1

## 2020-06-13 MED ORDER — DEXAMETHASONE SODIUM PHOSPHATE 10 MG/ML IJ SOLN
INTRAMUSCULAR | Status: AC
  Filled 2020-06-13: qty 1

## 2020-06-13 MED ORDER — LIDOCAINE 2% (20 MG/ML) 5 ML SYRINGE
INTRAMUSCULAR | Status: DC | PRN
  Administered 2020-06-13: 100 mg via INTRAVENOUS

## 2020-06-13 MED ORDER — VASOPRESSIN 20 UNIT/ML IV SOLN
INTRAVENOUS | Status: AC
  Filled 2020-06-13: qty 1

## 2020-06-13 MED ORDER — MIDAZOLAM HCL 2 MG/2ML IJ SOLN
INTRAMUSCULAR | Status: AC
  Filled 2020-06-13: qty 2

## 2020-06-13 MED ORDER — ARTIFICIAL TEARS OPHTHALMIC OINT
TOPICAL_OINTMENT | OPHTHALMIC | Status: AC
  Filled 2020-06-13: qty 3.5

## 2020-06-13 MED ORDER — LIDOCAINE HCL 2 % IJ SOLN
INTRAMUSCULAR | Status: AC
  Filled 2020-06-13: qty 40

## 2020-06-13 SURGICAL SUPPLY — 31 items
ADH SKN CLS APL DERMABOND .7 (GAUZE/BANDAGES/DRESSINGS) ×1
BAG SPEC RTRVL LRG 6X4 10 (ENDOMECHANICALS) ×2
BARRIER ADHS 3X4 INTERCEED (GAUZE/BANDAGES/DRESSINGS) ×3 IMPLANT
BRR ADH 4X3 ABS CNTRL BYND (GAUZE/BANDAGES/DRESSINGS) ×1
CABLE HIGH FREQUENCY MONO STRZ (ELECTRODE) ×3 IMPLANT
DERMABOND ADVANCED (GAUZE/BANDAGES/DRESSINGS) ×2
DERMABOND ADVANCED .7 DNX12 (GAUZE/BANDAGES/DRESSINGS) ×1 IMPLANT
GLOVE BIOGEL PI IND STRL 6 (GLOVE) ×2 IMPLANT
GLOVE BIOGEL PI INDICATOR 6 (GLOVE) ×4
GLOVE ECLIPSE 6.0 STRL STRAW (GLOVE) ×3 IMPLANT
GOWN STRL REUS W/TWL LRG LVL3 (GOWN DISPOSABLE) ×6 IMPLANT
IRRIG SUCT STRYKERFLOW 2 WTIP (MISCELLANEOUS) ×3
IRRIGATION SUCT STRKRFLW 2 WTP (MISCELLANEOUS) ×1 IMPLANT
KIT TURNOVER CYSTO (KITS) ×3 IMPLANT
LIGASURE VESSEL 5MM BLUNT TIP (ELECTROSURGICAL) IMPLANT
NS IRRIG 1000ML POUR BTL (IV SOLUTION) ×3 IMPLANT
PACK LAPAROSCOPY BASIN (CUSTOM PROCEDURE TRAY) ×3 IMPLANT
PACK TRENDGUARD 450 HYBRID PRO (MISCELLANEOUS) ×1 IMPLANT
POUCH SPECIMEN RETRIEVAL 10MM (ENDOMECHANICALS) ×6 IMPLANT
PROTECTOR NERVE ULNAR (MISCELLANEOUS) ×6 IMPLANT
SET SUCTION IRRIG HYDROSURG (IRRIGATION / IRRIGATOR) ×3 IMPLANT
SET TUBE SMOKE EVAC HIGH FLOW (TUBING) ×3 IMPLANT
SUT VICRYL 0 UR6 27IN ABS (SUTURE) ×3 IMPLANT
SUT VICRYL RAPIDE 3 0 (SUTURE) ×3 IMPLANT
TOWEL OR 17X26 10 PK STRL BLUE (TOWEL DISPOSABLE) ×3 IMPLANT
TRAP SPECIMEN MUCUS 40CC (MISCELLANEOUS) ×3 IMPLANT
TRAY FOLEY W/BAG SLVR 14FR LF (SET/KITS/TRAYS/PACK) ×3 IMPLANT
TRENDGUARD 450 HYBRID PRO PACK (MISCELLANEOUS) ×3
TROCAR BLADELESS OPT 5 100 (ENDOMECHANICALS) ×6 IMPLANT
TROCAR XCEL NON-BLD 11X100MML (ENDOMECHANICALS) ×6 IMPLANT
WARMER LAPAROSCOPE (MISCELLANEOUS) ×3 IMPLANT

## 2020-06-13 NOTE — Discharge Instructions (Signed)
Laparoscopic Cystectomy, Care After This sheet gives you information about how to care for yourself after your procedure. Your health care provider may also give you more specific instructions. If you have problems or questions, contact your health care provider. What can I expect after the procedure? After the procedure, it is common to have:  Pain or soreness in your abdomen.  The need to urinate often. This is because your bladder is smaller after the surgery. Follow these instructions at home: Medicines  Take over-the-counter and prescription medicines only as told by your health care provider.  Ask your health care provider if the medicine prescribed to you: ? Requires you to avoid driving or using heavy machinery. ? Can cause constipation. You may need to take these actions to prevent or treat constipation:  Take over-the-counter or prescription medicines.  Eat foods that are high in fiber, such as beans, whole grains, and fresh fruits and vegetables.  Limit foods that are high in fat and processed sugars, such as fried or sweet foods. Incision care   Follow instructions from your health care provider about how to take care of your incisions. Make sure you: ? Wash your hands with soap and water before and after you change your bandage (dressing). If soap and water are not available, use hand sanitizer. ? Change your dressing as told by your health care provider. ? Leave stitches (sutures), skin glue, or adhesive strips in place. These skin closures may need to be in place for 2 weeks or longer. If adhesive strip edges start to loosen and curl up, you may trim the loose edges. Do not remove adhesive strips completely unless your health care provider tells you to do that.  Check your incision areas every day for signs of infection. Check for: ? Redness, swelling, or pain. ? Fluid or blood. ? Warmth. ? Pus or a bad smell. Activity  Rest as told by your health care  provider.  Avoid sitting for a long time without moving. Get up to take short walks every 1-2 hours. This is important to improve blood flow and breathing. Ask for help if you feel weak or unsteady.  Do not lift anything that is heavier than 10 lb (4.5 kg), or the limit that you are told, until your health care provider says that it is safe.  Do not douche or have sex until your health care provider says you can.  Do not do exercises that put pressure on your abdominal muscles. These include sit-ups and weight lifting. General instructions  Follow your health care provider's instructions about how to take care of the narrow tube (catheter) inserted into your bladder or the small plastic tube draining fluid from the surgery area (surgical drain) if you have one or both.  Do not take baths, swim, or use a hot tub until your health care provider approves. Ask your health care provider if you may take showers. You may only be allowed to take sponge baths.  Drink enough fluid to keep your urine pale yellow as told by your health care provider.  If you have a catheter, you will have a procedure called a cystogram before your catheter is removed.  Keep all follow-up visits as told by your health care provider. This is important. Contact a health care provider if:  You have redness, swelling, or pain at your incision area.  You have trouble starting or controlling your urination.  Your urine becomes cloudy or dark, or it smells bad.  You have chills or a fever.  You have problems with your catheter or drain. Get help right away if:  You have bright red blood or blood clots in your urine.  You have trouble breathing.  You have chest pain  You have swelling in your legs.  You have fluid, blood, or pus coming from your incisions.  You cannot urinate.  You have urine leaking from around the catheter.  You have new pain in your abdomen, and you also  have: ? Nausea. ? Vomiting. ? Trouble passing gas or stool. Summary  After the procedure, it is common to have pain and soreness in your abdomen, and a need to urinate often.  Follow your health care provider's instructions about how to care for your incisions and drains at home.  Ask your healthy care provider about any lifting restrictions, and other activity restrictions.  You will be asked to rest. Get up to walk every 1-2 hours. This will promote healing and good breathing.  Contact a health care provider if you have redness or swelling at your incision sites. Get help right away if you start vomiting or you have trouble breathing. This information is not intended to replace advice given to you by your health care provider. Make sure you discuss any questions you have with your health care provider. Document Revised: 11/02/2018 Document Reviewed: 11/02/2018 Elsevier Patient Education  [place] Instructions  Activity: Get plenty of rest for the remainder of the day. A responsible individual must stay with you for 24 hours following the procedure.  For the next 24 hours, DO NOT: -Drive a car -Paediatric nurse -Drink alcoholic beverages -Take any medication unless instructed by your physician -Make any legal decisions or sign important papers.  Meals: Start with liquid foods such as gelatin or soup. Progress to regular foods as tolerated. Avoid greasy, spicy, heavy foods. If nausea and/or vomiting occur, drink only clear liquids until the nausea and/or vomiting subsides. Call your physician if vomiting continues.  Special Instructions/Symptoms: Your throat may feel dry or sore from the anesthesia or the breathing tube placed in your throat during surgery. If this causes discomfort, gargle with warm salt water. The discomfort should disappear within 24 hours.  If you had a scopolamine patch placed behind your ear for the management of  post- operative nausea and/or vomiting:  1. The medication in the patch is effective for 72 hours, after which it should be removed.  Wrap patch in a tissue and discard in the trash. Wash hands thoroughly with soap and water. 2. You may remove the patch earlier than 72 hours if you experience unpleasant side effects which may include dry mouth, dizziness or visual disturbances. 3. Avoid touching the patch. Wash your hands with soap and water after contact with the patch.    Remove patch behind right ear by Friday, September 24,2021.

## 2020-06-13 NOTE — Transfer of Care (Signed)
Immediate Anesthesia Transfer of Care Note  Patient: Tonya Brennan  Procedure(s) Performed: Procedure(s) (LRB): LAPAROSCOPIC OVARIAN CYSTECTOMY (Right)  Patient Location: PACU  Anesthesia Type: General  Level of Consciousness: awake, alert  and oriented  Airway & Oxygen Therapy: Patient Spontanous Breathing and Patient connected to nasal cannula oxygen  Post-op Assessment: Report given to PACU RN and Post -op Vital signs reviewed and stable  Post vital signs: Reviewed and stable  Complications: No apparent anesthesia complications Last Vitals:  Vitals Value Taken Time  BP 138/116 06/13/20 0945  Temp    Pulse 79 06/13/20 0951  Resp 20 06/13/20 0951  SpO2 91 % 06/13/20 0951  Vitals shown include unvalidated device data.  Last Pain:  Vitals:   06/13/20 0708  TempSrc: Oral  PainSc: 0-No pain      Patients Stated Pain Goal: 2 (33/83/29 1916)  Complications: No complications documented.

## 2020-06-13 NOTE — Discharge Summary (Signed)
Physician Discharge Summary  Patient ID: Tonya Brennan MRN: [id number] DOB/AGE: [birth year] 34 y.o.  Admit date: 06/13/2020 Discharge date: 06/13/2020  Admission Diagnoses: Right ovarian cyst  Discharge Diagnoses:  Active Problems:   Right ovarian cyst   Discharged Condition: good  Hospital Course:  The patient was admitted through pre-op holding where her consent was reviewed and all questions were answered. She was taken to the operating room by attending surgeon Dr. Shellia Cleverly. She underwent an uncomplicated laparoscopic right cystectomy. Please see operative dictation for further details. Following her surgery she was taken to the PACU for recovery and then transferred to extended recovery. Her foley was removed prior to leaving the OR. She was tolerating a regular diet, her pain was controlled on po pain medications, she was ambulating without assistance and voiding spontaneously. By POD#0, she was meeting all goals and deemed stable for discharge.  Consults: None  Significant Diagnostic Studies:  No results for input(s): WBC, HGB, HCT, PLT, NA, K, CL, BUN, CREATININE, AST, ALT, BILITOT in the last 72 hours. Treatments: surgery Discharge Exam: Blood pressure (!) 135/93, pulse 83, temperature (!) 97.4 F (36.3 C), temperature source Oral, resp. rate 20, height 5\' 6"  (1.676 m), weight 80.1 kg, last menstrual period 04/06/2020, SpO2 98 %. Resting comfortably Abdomen soft, nondistended SCDs on  Disposition: Discharge disposition: 01-Home or Self Care        Discharge Instructions    Call MD for:  difficulty breathing, headache or visual disturbances   Complete by: As directed    Call MD for:  extreme fatigue   Complete by: As directed    Call MD for:  hives   Complete by: As directed    Call MD for:  persistant dizziness or light-headedness   Complete by: As directed    Call MD for:  persistant nausea and vomiting   Complete by: As directed    Call MD for:  redness,  tenderness, or signs of infection (pain, swelling, redness, odor or green/yellow discharge around incision site)   Complete by: As directed    Call MD for:  severe uncontrolled pain   Complete by: As directed    Call MD for:  temperature >100.4   Complete by: As directed    Diet - low sodium heart healthy   Complete by: As directed    Increase activity slowly   Complete by: As directed    Lifting restrictions   Complete by: As directed    No lifting >10 lbs for 4 weeks   No dressing needed   Complete by: As directed      Allergies as of 06/13/2020      Reactions   Percocet [oxycodone-acetaminophen] Itching, Nausea And Vomiting   Theraflu Max-d Cold & Flu [pseudoephedrine-dm-gg-apap] Nausea And Vomiting      Medication List    TAKE these medications   amLODipine 10 MG tablet Commonly known as: NORVASC Take 10 mg by mouth daily.   HYDROcodone-acetaminophen 5-325 MG tablet Commonly known as: NORCO/VICODIN Take 1 tablet by mouth every 6 (six) hours as needed for moderate pain.   ibuprofen 800 MG tablet Commonly known as: ADVIL Take 1 tablet (800 mg total) by mouth every 8 (eight) hours as needed. Notes to patient: Toradol given at 0922 in surgery.   methocarbamol 500 MG tablet Commonly known as: ROBAXIN Take 1 tablet (500 mg total) by mouth 2 (two) times daily as needed for muscle spasms.  Discharge Care Instructions  (From admission, onward)         Start     Ordered   06/13/20 0000  No dressing needed        06/13/20 3295          Follow-up Information    Jonelle Sidle, MD. Go on 06/28/2020.   Specialty: Obstetrics and Gynecology Contact information: [address] [place] [address] [phone]               Signed: Jonelle Sidle 06/13/2020, 4:02 PM

## 2020-06-13 NOTE — Anesthesia Procedure Notes (Signed)
Procedure Name: Intubation Date/Time: 06/13/2020 7:45 AM Performed by: Mechele Claude, CRNA Pre-anesthesia Checklist: Patient identified, Emergency Drugs available, Suction available and Patient being monitored Patient Re-evaluated:Patient Re-evaluated prior to induction Oxygen Delivery Method: Circle system utilized Preoxygenation: Pre-oxygenation with 100% oxygen Induction Type: IV induction Ventilation: Mask ventilation without difficulty Grade View: Grade I Tube type: Oral Tube size: 7.0 mm Number of attempts: 1 Airway Equipment and Method: Stylet and Oral airway Placement Confirmation: ETT inserted through vocal cords under direct vision,  positive ETCO2 and breath sounds checked- equal and bilateral Secured at: 22 cm Tube secured with: Tape Dental Injury: Teeth and Oropharynx as per pre-operative assessment

## 2020-06-13 NOTE — Op Note (Signed)
Operative Note PATIENT:  Tonya Brennan  34 y.o. female  PRE-OPERATIVE DIAGNOSIS:  right ovarian dermoid cyst  POST-OPERATIVE DIAGNOSIS:  right ovarian dermoid cyst  PROCEDURE:  Procedure(s): LAPAROSCOPIC OVARIAN CYSTECTOMY (Right)  SURGEON:  Surgeon(s) and Role:    * Taam-Akelman, Lawrence Santiago, MD - Primary *Irene Pap, MD  ASSISTANTS: none   ANESTHESIA:   general  EBL:  20 mL   BLOOD ADMINISTERED:none  DRAINS: none   LOCAL MEDICATIONS USED:  MARCAINE     SPECIMEN:  Source of Specimen:  right ovarian cyst  DISPOSITION OF SPECIMEN:  PATHOLOGY  COUNTS:  YES  TOURNIQUET:  * No tourniquets in log *  PLAN OF CARE: Discharge to home after PACU  PATIENT DISPOSITION:  PACU - hemodynamically stable.   Delay start of Pharmacological VTE agent (>24hrs) due to surgical blood loss or risk of bleeding: not applicable  Findings: Exam under anesthesia revealed small mobile anteverted uterus. Right adnexa mass, 5cm, in the cul de sac, mobile. Diagnostic laparoscopy revealed normal left ovary, bilateral fallopian tubes and uterus. Right fallopian tube adhered to bowel. Right ovary with 5cm thin walled cyst. Normal appearing appendix. Upper abdomen with filmy adhesions around the liver otherwise normal appearing.   Description of procedure  After consent was verified, the patient was taken to the operating room where general anesthesia was administered without difficulty. The patient was placed in the dorsal lithotomy position using Allen stirrups.  Exam under anesthesia was performed, revealing a small mobile anteverted uterus and 5cm mobile right adnexa mass. The abdomen and vagina were subsequently prepped and draped in the normal sterile fashion. A foley catheter was placed. A speculum was placed in the patient's vagina and the anterior lip of the cervix was grasped with a single-tooth tenaculum and a hulka manipulator was placed through the os and secured to the  anterior lip. The tenaculum and speculum were removed.   Attention was then turned to the patient's abdomen where a 9mm vertical skin incision was made at the base of the umbilicus The abdominal wall was tented and the 19mm Optiview trochar and sleeve were advanced, was advanced without difficulty into the abdomen where intra-abdominal placement was confirmed by laparoscope. A second 45mm skin incision was made approximately 4cm above the right anterior superior iliac spine.  The second trochar and sleeve were then advanced under direct visualization. A third 62mm skin incision was made and trochar was placed in similar fashion approximately 4cm above the left anterior superior iliac spine. A blunt grasper was used to sweep the bowel superiorly and provide adequate visualization of the female anatomy. A survey of the patient's pelvis and abdomen: revealed normal left ovary, bilateral fallopian tubes and uterus. Right fallopian tube adhered to bowel. Right ovary with 5cm thin walled cyst. Normal appearing appendix. Upper abdomen with filmy adhesions around the liver otherwise normal appearing. Pelvic washings were collected.   Attention turned to the right ovarian cyst. 5cc vasopressin injected around the cyst capsule for hydrodissection. The monopolar scissors were used to incise the cyst wall, which was very thin and the cyst was ruptured. The cyst had sebaceous fluid and hair. An additional 70mm suprapubic port was placed under direct visualization. The cyst was copiously irrigated and suctioned. The cyst was then excised from the normal ovarian tissue using the monopolar scissors. The cyst was placed in an endocatch bag and removed through the suprapubic port. Intraabdominal pressure was decreased and the ovary was noted to be hemostatic. The pelvis and abdomen were  irrigated again. Interceed was placed over the ovary.  All instruments were removed from the patient's abdomen. The umbilucis fascia and  suprapubic port was closed with vicryl on a UR 6 in a figure of eight fashion. The skin incisions were repaired with vicryl. Marcaine was injected at the skin incisions. The uterine manipulator was removed from the vagina and the cervix was hemostatic. The patient tolerated the procedure well and was taken to the recovery room in stable condition. Sponge, lap, and needle counts were correct x3.  Peggi Yono K Taam-Akelman 06/13/20 10:35 AM

## 2020-06-13 NOTE — Anesthesia Postprocedure Evaluation (Signed)
Anesthesia Post Note  Patient: Tonya Brennan  Procedure(s) Performed: LAPAROSCOPIC OVARIAN CYSTECTOMY (Right Abdomen)     Patient location during evaluation: PACU Anesthesia Type: General Level of consciousness: awake and alert, oriented and awake Pain management: pain level controlled Vital Signs Assessment: post-procedure vital signs reviewed and stable Respiratory status: spontaneous breathing, nonlabored ventilation, respiratory function stable and patient connected to nasal cannula oxygen Cardiovascular status: blood pressure returned to baseline and stable Postop Assessment: no apparent nausea or vomiting Anesthetic complications: no   No complications documented.  Last Vitals:  Vitals:   06/13/20 1115 06/13/20 1234  BP: (!) 127/96 (!) 135/93  Pulse: 83 83  Resp: 18 20  Temp:  (!) 36.3 C  SpO2: 100% 98%    Last Pain:  Vitals:   06/13/20 1234  TempSrc: Oral  PainSc:                  Catalina Gravel

## 2020-06-13 NOTE — Brief Op Note (Signed)
06/13/2020  9:30 AM  PATIENT:  Tonya Brennan  34 y.o. female  PRE-OPERATIVE DIAGNOSIS:  right ovarian dermoid cyst  POST-OPERATIVE DIAGNOSIS:  right ovarian dermoid cyst  PROCEDURE:  Procedure(s): LAPAROSCOPIC OVARIAN CYSTECTOMY (Right)  SURGEON:  Surgeon(s) and Role:    * Taam-Akelman, Lawrence Santiago, MD - Primary *Irene Pap, MD  ASSISTANTS: none   ANESTHESIA:   general  EBL:  20 mL   BLOOD ADMINISTERED:none  DRAINS: none   LOCAL MEDICATIONS USED:  MARCAINE     SPECIMEN:  Source of Specimen:  right ovarian cyst  DISPOSITION OF SPECIMEN:  PATHOLOGY  COUNTS:  YES  TOURNIQUET:  * No tourniquets in log *  DICTATION: .Note written in EPIC  PLAN OF CARE: Discharge to home after PACU  PATIENT DISPOSITION:  PACU - hemodynamically stable.   Delay start of Pharmacological VTE agent (>24hrs) due to surgical blood loss or risk of bleeding: not applicable

## 2020-06-13 NOTE — Interval H&P Note (Signed)
History and Physical Interval Note:  06/13/2020 7:19 AM  Tonya Brennan  has presented today for surgery, with the diagnosis of right ovarian dermoid cyst.  The various methods of treatment have been discussed with the patient and family. After consideration of risks, benefits and other options for treatment, the patient has consented to  Procedure(s): LAPAROSCOPIC OVARIAN CYSTECTOMY (Right) as a surgical intervention.  The patient's history has been reviewed, patient examined, no change in status, stable for surgery.  I have reviewed the patient's chart and labs.  Questions were answered to the patient's satisfaction.     Rosella Crandell K Taam-Akelman

## 2020-06-14 ENCOUNTER — Encounter (HOSPITAL_BASED_OUTPATIENT_CLINIC_OR_DEPARTMENT_OTHER): Payer: Self-pay | Admitting: Obstetrics & Gynecology

## 2020-06-14 LAB — SURGICAL PATHOLOGY

## 2020-06-14 LAB — CYTOLOGY - NON PAP

## 2020-12-28 ENCOUNTER — Emergency Department (HOSPITAL_BASED_OUTPATIENT_CLINIC_OR_DEPARTMENT_OTHER)
Admission: EM | Admit: 2020-12-28 | Discharge: 2020-12-28 | Disposition: A | Discharge status: Home / Self Care | Payer: Self-pay | Attending: Emergency Medicine | Admitting: Emergency Medicine

## 2020-12-28 ENCOUNTER — Other Ambulatory Visit: Payer: Self-pay

## 2020-12-28 ENCOUNTER — Emergency Department (HOSPITAL_BASED_OUTPATIENT_CLINIC_OR_DEPARTMENT_OTHER): Payer: Self-pay

## 2020-12-28 ENCOUNTER — Encounter (HOSPITAL_BASED_OUTPATIENT_CLINIC_OR_DEPARTMENT_OTHER): Payer: Self-pay

## 2020-12-28 DIAGNOSIS — Z87891 Personal history of nicotine dependence: Secondary | ICD-10-CM | POA: Insufficient documentation

## 2020-12-28 DIAGNOSIS — I10 Essential (primary) hypertension: Secondary | ICD-10-CM | POA: Insufficient documentation

## 2020-12-28 DIAGNOSIS — Z79899 Other long term (current) drug therapy: Secondary | ICD-10-CM | POA: Insufficient documentation

## 2020-12-28 DIAGNOSIS — M25461 Effusion, right knee: Secondary | ICD-10-CM

## 2020-12-28 LAB — SYNOVIAL CELL COUNT + DIFF, W/ CRYSTALS
Crystals, Fluid: NONE SEEN
Eosinophils-Synovial: 0 % (ref 0–1)
Lymphocytes-Synovial Fld: 0 % (ref 0–20)
Monocyte-Macrophage-Synovial Fluid: 27 % — ABNORMAL LOW (ref 50–90)
Neutrophil, Synovial: 73 % — ABNORMAL HIGH (ref 0–25)
WBC, Synovial: 11900 /mm3 — ABNORMAL HIGH (ref 0–200)

## 2020-12-28 LAB — GRAM STAIN

## 2020-12-28 MED ORDER — IBUPROFEN 400 MG PO TABS
600.0000 mg | ORAL_TABLET | Freq: Once | ORAL | Status: AC
  Administered 2020-12-28: 600 mg via ORAL
  Filled 2020-12-28: qty 1

## 2020-12-28 MED ORDER — HYDROCODONE-ACETAMINOPHEN 5-325 MG PO TABS
1.0000 | ORAL_TABLET | Freq: Once | ORAL | Status: AC
  Administered 2020-12-28: 1 via ORAL
  Filled 2020-12-28: qty 1

## 2020-12-28 MED ORDER — LIDOCAINE-EPINEPHRINE (PF) 2 %-1:200000 IJ SOLN
10.0000 mL | Freq: Once | INTRAMUSCULAR | Status: AC
  Administered 2020-12-28: 10 mL
  Filled 2020-12-28: qty 20

## 2020-12-28 NOTE — ED Provider Notes (Signed)
[facility] EMERGENCY DEPARTMENT Provider Note   CSN: [id number] Arrival date & time: 12/28/20  1030     History Chief Complaint  Patient presents with  . Knee Pain    Tonya Brennan is a 35 y.o. female.  HPI 35 year old female presents with right knee pain and swelling.  She states she partied over the weekend and then when she woke up Monday, 4/4 she noticed some right knee pain.  Over the last couple days she has had swelling into her knee.  She has been trying NSAIDs and ice.  Does not remember any obvious trauma.  No fevers.  It is painful to flex/extend.  She is able to walk but it is painful.  No concern for STI.   Past Medical History:  Diagnosis Date  . History of kidney stones   . Hypertension   . Right ovarian cyst   . Thyroid disease     Patient Active Problem List   Diagnosis Date Noted  . Right ovarian cyst 06/13/2020  . 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 Cyst, ovary, dermoid, right 11/25/2018  . Hyperparathyroidism ([place]) 09/13/2018  . Nephrolithiasis 08/10/2018  . Right kidney stone 08/10/2018    Past Surgical History:  Procedure Laterality Date  . CYSTOSCOPY W/ URETERAL STENT PLACEMENT Right 06/03/2019   Procedure: CYSTOSCOPY WITH RETROGRADE PYELOGRAM/URETERAL STENT PLACEMENT/STONE REMOVAL WITH BASKET;  Surgeon: Cleon Gustin, MD;  Location: [facility];  Service: Urology;  Laterality: Right;  . IR NEPHROSTOGRAM RIGHT THRU EXISTING ACCESS  08/14/2018  . IR URETERAL STENT PLACEMENT EXISTING ACCESS RIGHT  08/19/2018  . IR URETERAL STENT RIGHT NEW ACCESS W/O SEP NEPHROSTOMY CATH  08/10/2018  . LAPAROSCOPIC OVARIAN CYSTECTOMY Right 06/13/2020   Procedure: LAPAROSCOPIC OVARIAN CYSTECTOMY;  Surgeon: Jonelle Sidle, MD;  Location: [facility];  Service: Gynecology;  Laterality: Right;  . NEPHROLITHOTOMY Right 08/11/2018   Procedure: NEPHROLITHOTOMY PERCUTANEOUS;  Surgeon: Irine Seal, MD;  Location: [facility];  Service: Urology;   Laterality: Right;  . PARATHYROIDECTOMY Left 04/25/2020   Procedure: LEFT PARATHYROIDECTOMY;  Surgeon: Leta Baptist, MD;  Location: [facility];  Service: ENT;  Laterality: Left;     OB History   No obstetric history on file.     History reviewed. No pertinent family history.  Social History   Tobacco Use  . Smoking status: Former Smoker    Types: Cigarettes  . Smokeless tobacco: Never Used  . Tobacco comment: quit 15 yrs ago  Vaping Use  . Vaping Use: Never used  Substance Use Topics  . Alcohol use: Not Currently  . Drug use: Yes    Types: Marijuana    Comment: daily use     Home Medications Prior to Admission medications   Medication Sig Start Date End Date Taking? Authorizing Provider  amLODipine (NORVASC) 10 MG tablet Take 10 mg by mouth daily.    [provider]  HYDROcodone-acetaminophen (NORCO/VICODIN) 5-325 MG tablet Take 1 tablet by mouth every 6 (six) hours as needed for moderate pain. 06/13/20   Taam-Akelman, Lawrence Santiago, MD  ibuprofen (ADVIL) 800 MG tablet Take 1 tablet (800 mg total) by mouth every 8 (eight) hours as needed. 06/13/20   Taam-Akelman, Lawrence Santiago, MD  methocarbamol (ROBAXIN) 500 MG tablet Take 1 tablet (500 mg total) by mouth 2 (two) times daily as needed for muscle spasms. 04/05/20   Caccavale, Sophia, PA-C    Allergies    Percocet [oxycodone-acetaminophen] and Theraflu max-d cold & flu [pseudoephedrine-[id number]]  Review of  Systems   Review of Systems  Constitutional: Negative for fever.  Musculoskeletal: Positive for arthralgias and joint swelling.    Physical Exam Updated Vital Signs BP (!) 147/105 (BP Location: Right Arm)   Pulse 85   Temp 98 F (36.7 C) (Oral)   Resp 18   Ht 5\' 6"  (1.676 m)   Wt 77.1 kg   LMP 12/06/2020   SpO2 99%   BMI 27.44 kg/m   Physical Exam Vitals and nursing note reviewed.  Constitutional:      Appearance: She is well-developed.  HENT:     Head: Normocephalic and atraumatic.      Right Ear: External ear normal.     Left Ear: External ear normal.     Nose: Nose normal.  Eyes:     General:        Right eye: No discharge.        Left eye: No discharge.  Cardiovascular:     Rate and Rhythm: Normal rate and regular rhythm.     Pulses:          Dorsalis pedis pulses are 2+ on the right side.  Pulmonary:     Effort: Pulmonary effort is normal.  Abdominal:     General: There is no distension.     Tenderness: There is no abdominal tenderness.  Musculoskeletal:     Right knee: Swelling and effusion present. No erythema. Decreased range of motion. Tenderness present.     Comments: Mild warmth to right knee joint. No erythema. Pain with passive/active ROM  Skin:    General: Skin is warm and dry.  Neurological:     Mental Status: She is alert.  Psychiatric:        Mood and Affect: Mood is not anxious.     ED Results / Procedures / Treatments   Labs (all labs ordered are listed, but only abnormal results are displayed) Labs Reviewed  BODY FLUID CULTURE  GRAM STAIN  SYNOVIAL CELL COUNT + DIFF, W/ CRYSTALS    EKG None  Radiology DG Knee Complete 4 Views Right  Result Date: 12/28/2020 CLINICAL DATA:  [place] 2 days ago with pain and swelling. EXAM: RIGHT KNEE - COMPLETE 4+ VIEW COMPARISON:  None. FINDINGS: Large knee joint effusion. No visible fracture or sign of previous dislocation. IMPRESSION: Large knee joint effusion. No visible fracture or dislocation. Electronically Signed   By: Nelson Chimes M.D.   On: 12/28/2020 11:25    Procedures .Joint Aspiration/Arthrocentesis  Date/Time: 12/28/2020 12:23 PM Performed by: Sherwood Gambler, MD Authorized by: Sherwood Gambler, MD   Consent:    Consent obtained:  Verbal and written   Consent given by:  Patient   Risks, benefits, and alternatives were discussed: yes     Risks discussed:  Bleeding, incomplete drainage, nerve damage, infection and pain Universal protocol:    Patient identity confirmed:  Verbally with  patient Location:    Location:  Knee   Knee:  R knee Anesthesia:    Anesthesia method:  Local infiltration   Local anesthetic:  Lidocaine 2% WITH epi Procedure details:    Preparation: Patient was prepped and draped in usual sterile fashion     Needle gauge:  18 G   Approach:  Lateral   Aspirate amount:  45 mL   Aspirate characteristics:  Yellow   Steroid injected: no   Post-procedure details:    Dressing:  Adhesive bandage   Procedure completion:  Tolerated well, no immediate complications  Medications Ordered in ED Medications  HYDROcodone-acetaminophen (NORCO/VICODIN) 5-325 MG per tablet 1 tablet (1 tablet Oral Given 12/28/20 1113)  ibuprofen (ADVIL) tablet 600 mg (600 mg Oral Given 12/28/20 1113)  lidocaine-EPINEPHrine (XYLOCAINE W/EPI) 2 %-1:200000 (PF) injection 10 mL (10 mLs Infiltration Given 12/28/20 1114)    ED Course  I have reviewed the triage vital signs and the nursing notes.  Pertinent labs & imaging results that were available during my care of the patient were reviewed by me and considered in my medical decision making (see chart for details).    MDM Rules/Calculators/A&P                          No obvious trauma to her knee.  Given the knee effusion with mild warmth, arthrocentesis was performed after informed consent.  X-ray is unremarkable and has been reviewed by myself.  Otherwise to the naked eye the fluid looks okay.  Discussed waiting for results which could be a few hours versus going home.  Patient does not want to wait any longer.  I think this is fairly reasonable given lower suspicion for septic joint but I did discuss that she might have to come back if her labs are significantly abnormal or there is bacteria.  She understands this.  Will refer to sports medicine. Continue NSAIDs Final Clinical Impression(s) / ED Diagnoses Final diagnoses:  Effusion of right knee    Rx / DC Orders ED Discharge Orders    None       Sherwood Gambler,  MD 12/28/20 1232

## 2020-12-28 NOTE — ED Notes (Signed)
Dr. Regenia Skeeter at bedside for arthrocentisis, timeout completed

## 2020-12-28 NOTE — ED Triage Notes (Signed)
Right knee pain started on Monday and getting more swollen/painful gradually since then, today 10/10.  Patient reports taking Ibuprofen, keeping elevated, ice 20 minutes 4-5 times per day

## 2020-12-28 NOTE — Discharge Instructions (Signed)
If you develop fever, redness to your knee, new or worsening pain or swelling, or any other new/concerning symptoms then return to the ER for evaluation.  Otherwise follow-up with the sports medicine doctor as above.

## 2020-12-28 NOTE — ED Notes (Signed)
Consent for procedure signed by patient and physician

## 2021-07-12 IMAGING — DX DG CHEST 2V
2 series · 2 of 2 positions shown · non-contrast
Comparison: 08/11/2018

CLINICAL DATA: Chest and upper back pain

EXAM:
CHEST - 2 VIEW

[chest pa]
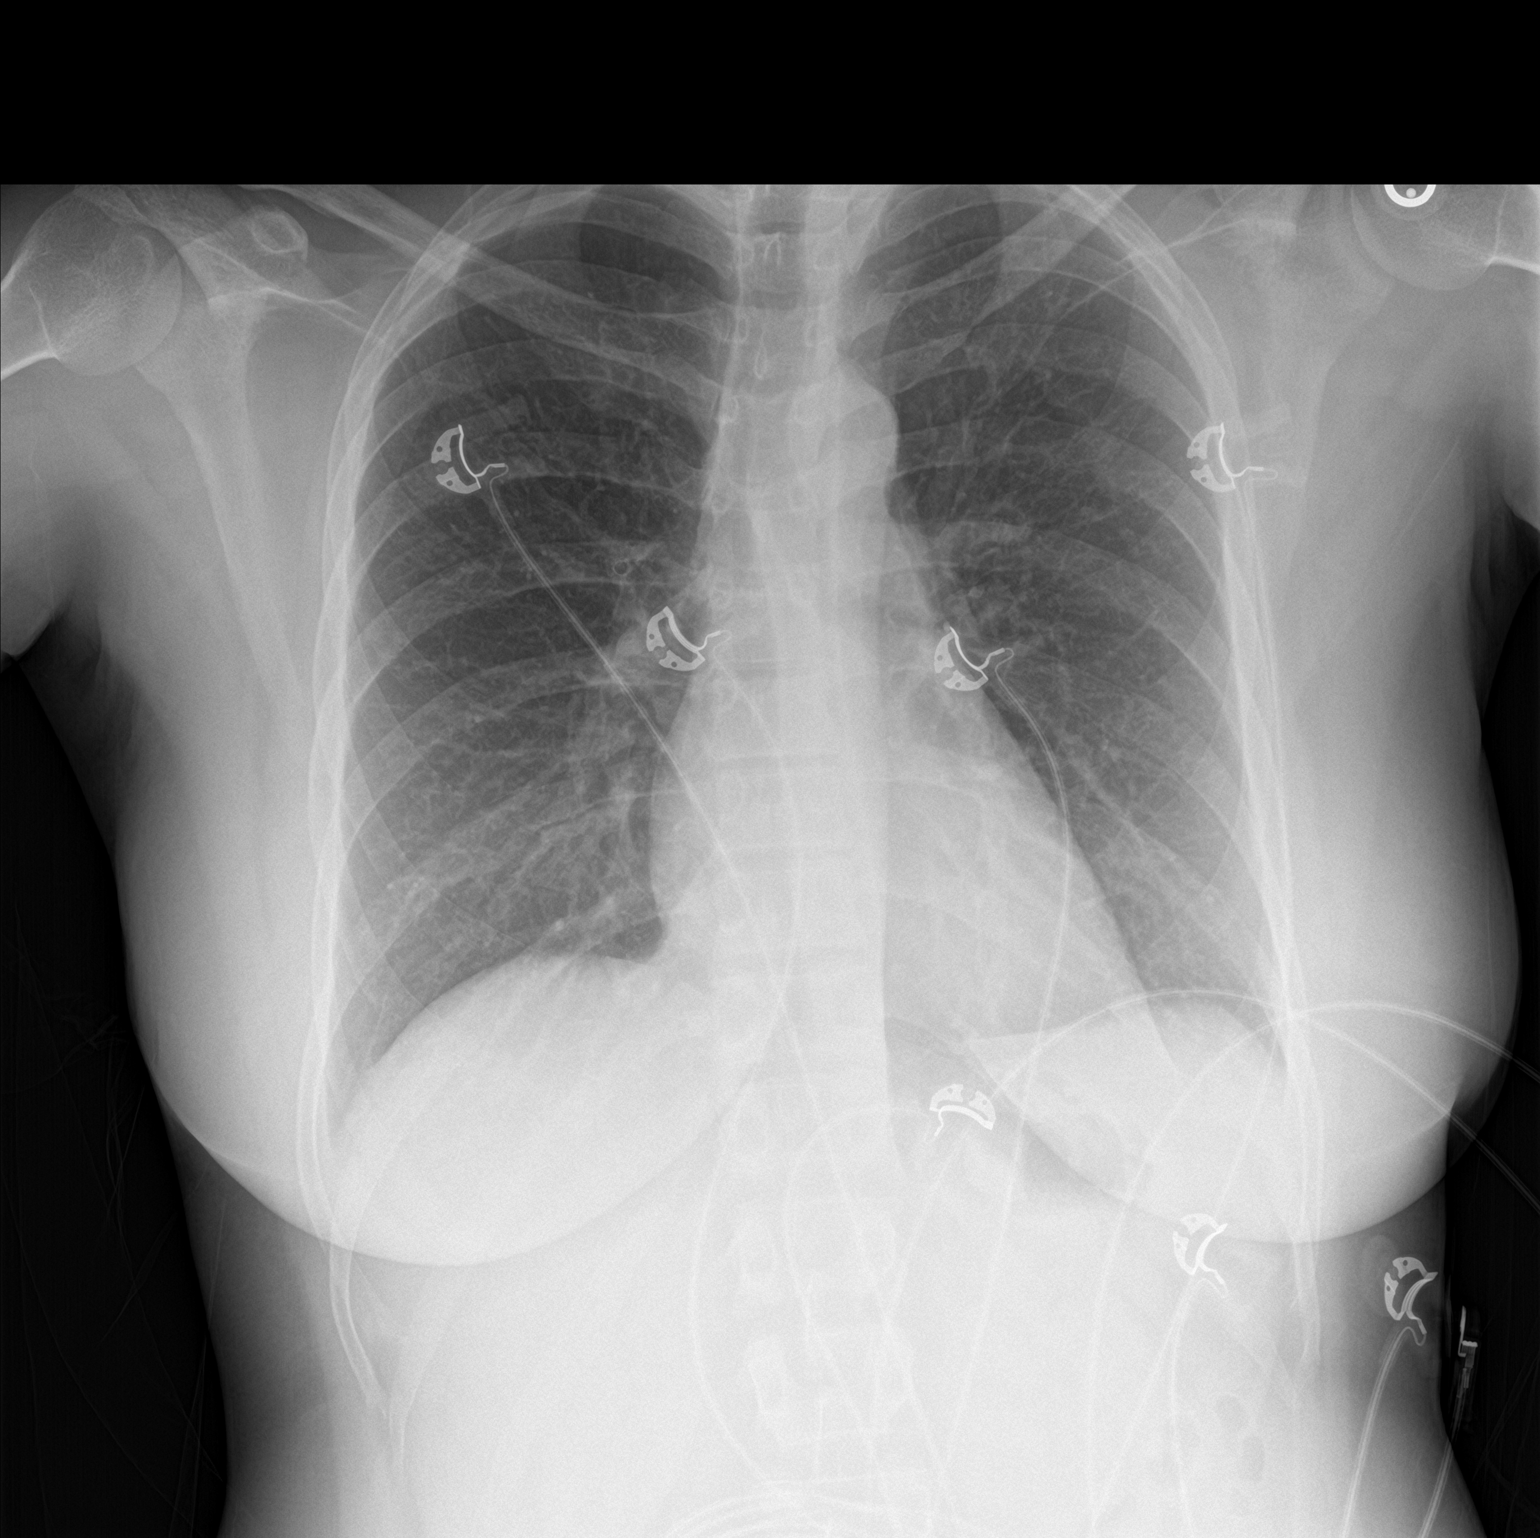

[chest lat]
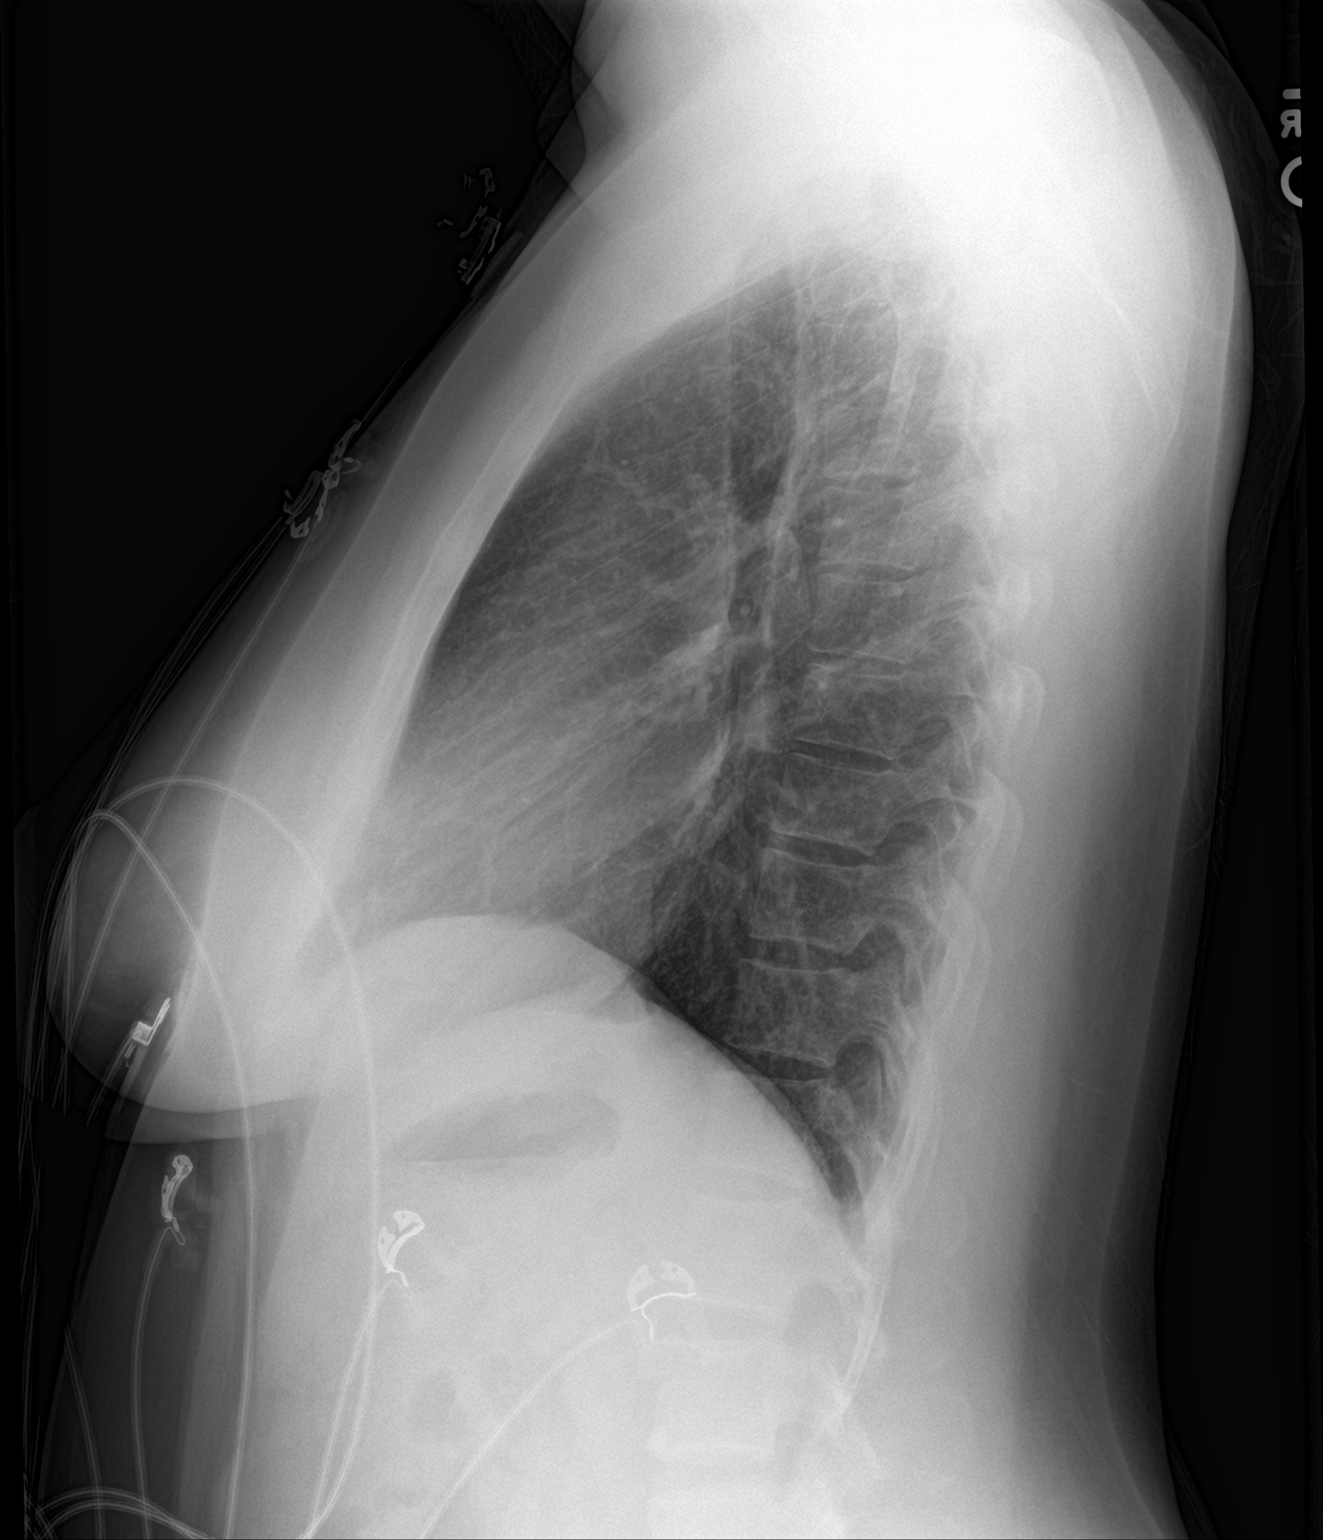

[2 of 2 positions shown; findings below may reference images not displayed]

FINDINGS: The heart size and mediastinal contours are within normal limits.
Both lungs are clear. The visualized skeletal structures are
unremarkable.
IMPRESSION: No acute abnormality of the lungs.

## 2021-07-12 IMAGING — CT CT RENAL STONE PROTOCOL
2 of 4 series · 15 of 46 positions shown, 17 images · non-contrast
Comparison: CT scan 08/03/2018

CLINICAL DATA: Two week history of mid back pain. History of renal
calculi.

EXAM:
CT ABDOMEN AND PELVIS WITHOUT CONTRAST
TECHNIQUE: Multidetector CT imaging of the abdomen and pelvis was performed
following the standard protocol without IV contrast.

[Series 2: axial st · axial · 0.87mm/px · z∈[-483,-38]mm · 12 of 103 slices shown, 14 images]
[im 9/103  soft-tissue]
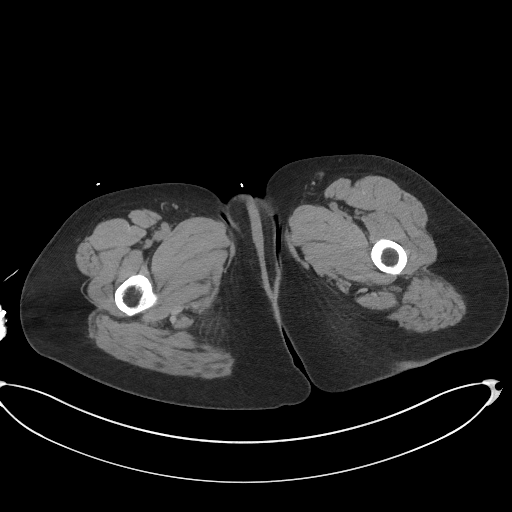
[im 9/103  bone]
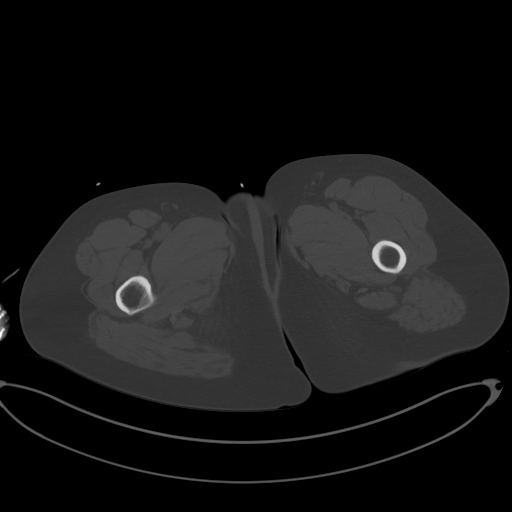
[im 17/103  soft-tissue]
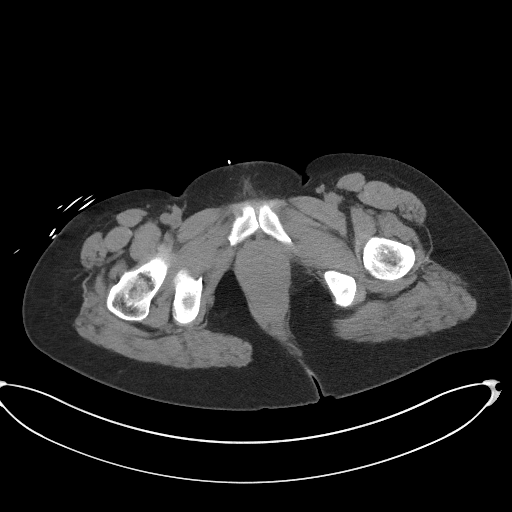
[im 25/103  soft-tissue]
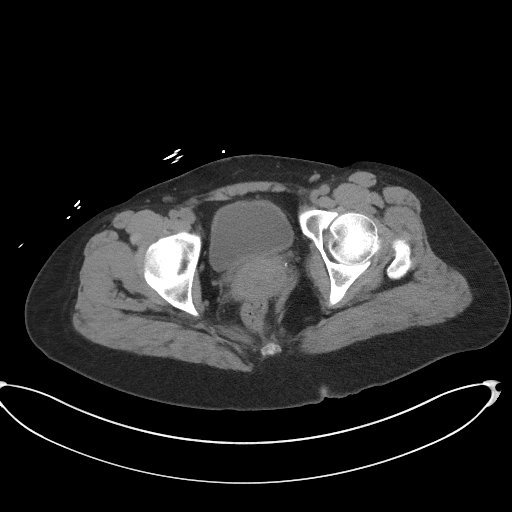
[im 33/103  soft-tissue]
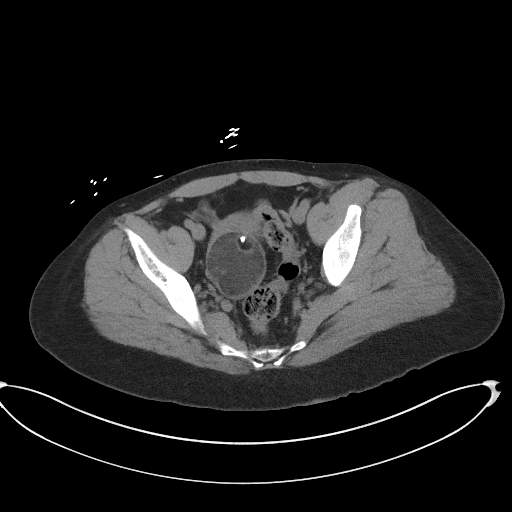
[im 41/103  soft-tissue]
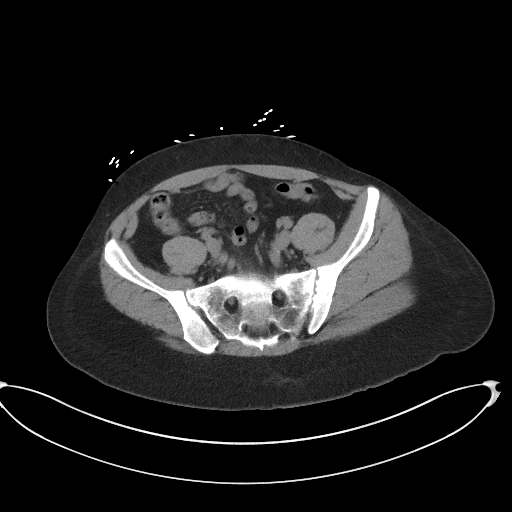
[im 49/103  soft-tissue]
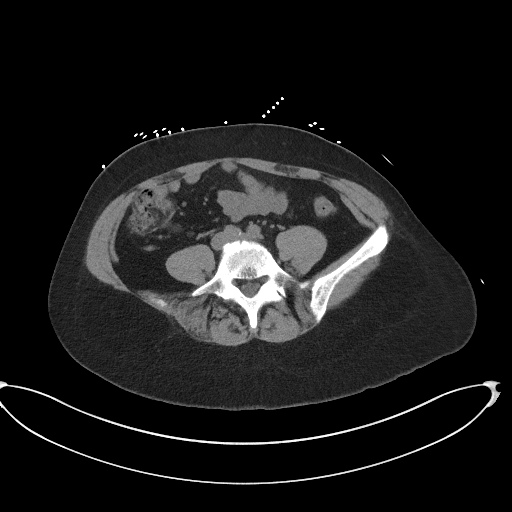
[im 58/103  soft-tissue]
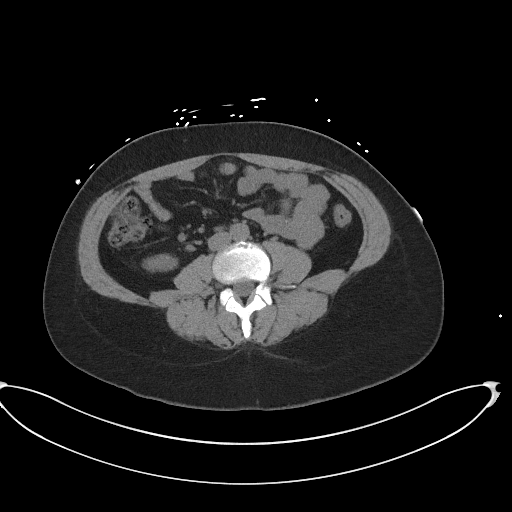
[im 66/103  soft-tissue]
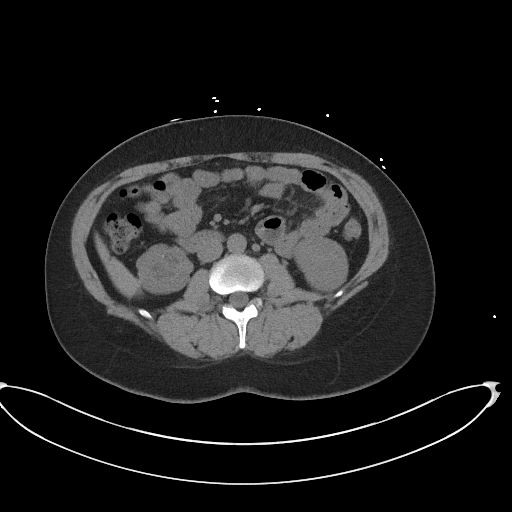
[im 74/103  soft-tissue]
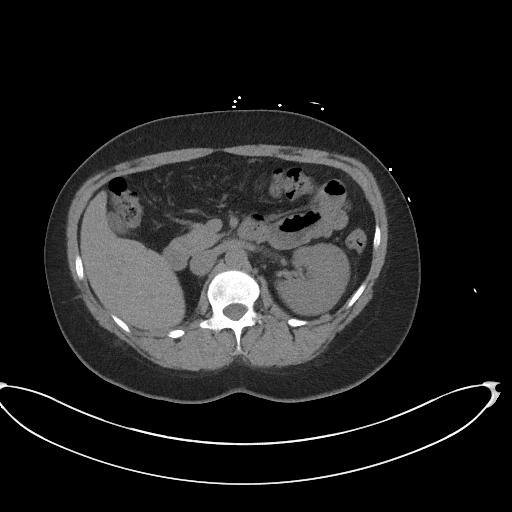
[im 74/103  bone]
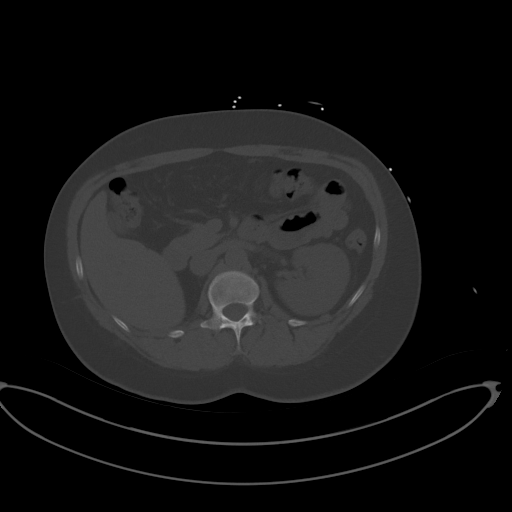
[im 82/103  soft-tissue]
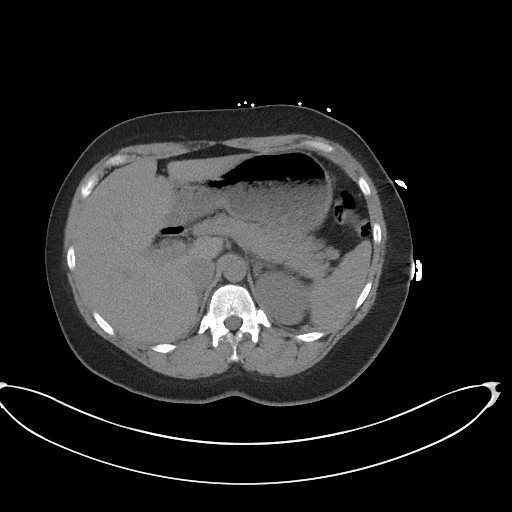
[im 90/103  soft-tissue]
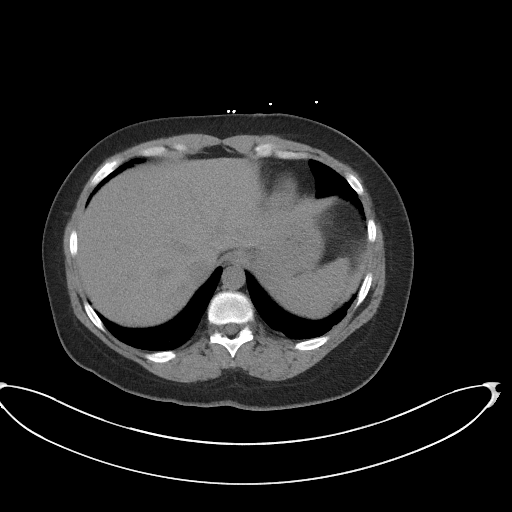
[im 98/103  soft-tissue]
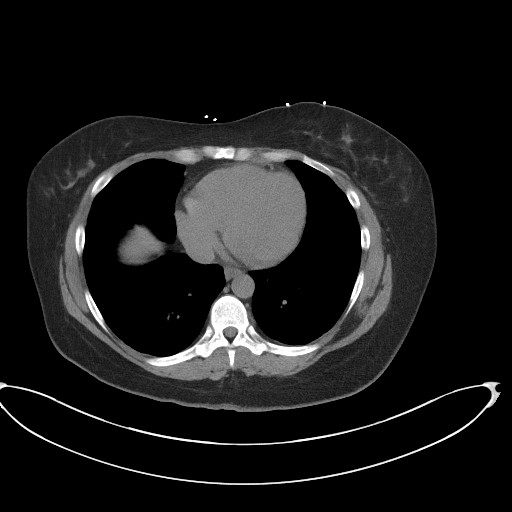

[Series 4: coronal st · coronal · 0.78mm/px · 3 of 88 slices shown]
[im 30/88  soft-tissue]
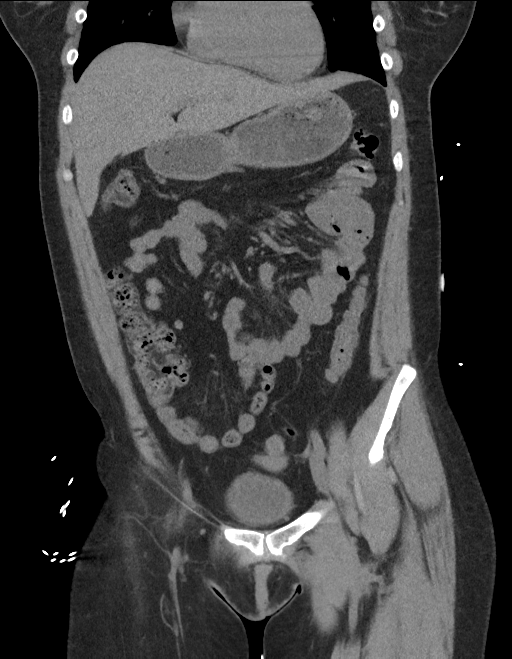
[im 39/88  soft-tissue]
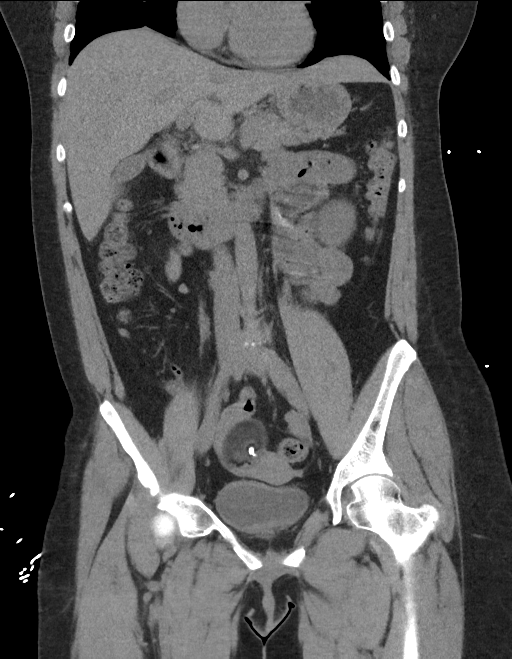
[im 49/88  soft-tissue]
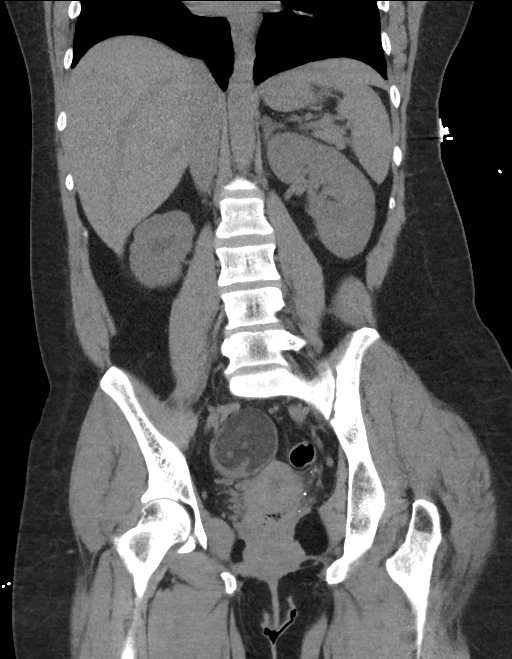

[15 of 46 positions shown; findings below may reference images not displayed]

FINDINGS: Lower chest: The lung bases are clear of an acute process. No
worrisome pulmonary lesions or pleural effusions. The heart is
normal in size. No pericardial effusion.

Hepatobiliary: No focal hepatic lesions or intrahepatic biliary
dilatation. The gallbladder is unremarkable. No common bile duct
dilatation.

Pancreas: No mass, inflammation or ductal dilatation.

Spleen: Normal size.  No focal lesions.

Adrenals/Urinary Tract: Adrenal glands are unremarkable and stable.

Left kidney: There is a 9 mm midpole calculus which appears
relatively stable. No left-sided obstructing ureteral calculi. No
left-sided hydroureteronephrosis.

The right kidney is small and demonstrates scarring changes. There
are several small renal calculi. There is a 4 mm calculus in the
upper right ureter near the UPJ. This measured 15 mm on the prior CT
scan. There is mild right-sided hydronephrosis. No distal right
ureteral calculi.

Bladder: No bladder mass or bladder calculi.

Stomach/Bowel: The stomach, duodenum, small bowel and colon are
grossly normal without oral contrast. No acute inflammatory changes,
mass lesions or obstructive findings. The terminal ileum and
appendix are normal.

Vascular/Lymphatic: The aorta is normal in caliber. Minimal
atheroscerlotic calcifications. No mesenteric of retroperitoneal
mass or adenopathy. Small scattered lymph nodes are noted.

Reproductive: The uterus is unremarkable. The left ovary is normal
and stable.

Stable right ovarian dermoid measuring 5.6 x 5.1 cm and containing
fat, some fluid and calcification.

Other: No pelvic lymphadenopathy or free pelvic fluid collections.
No inguinal mass or adenopathy.

Musculoskeletal: No significant bony findings. Limbus vertebrae
noted at L4 and L5.
IMPRESSION: 1. Bilateral renal calculi as detailed above.
2. 4 mm calculus in the upper right ureter near the UPJ with mild
hydronephrosis.
3. No worrisome renal or bladder lesions are identified without
contrast.
4. Stable small scarred right kidney.
5. Stable appearing right ovarian dermoid measuring 5.6 x 5.1 cm.

## 2022-07-02 ENCOUNTER — Encounter (HOSPITAL_BASED_OUTPATIENT_CLINIC_OR_DEPARTMENT_OTHER): Payer: Self-pay | Admitting: Emergency Medicine

## 2022-07-02 ENCOUNTER — Emergency Department (HOSPITAL_BASED_OUTPATIENT_CLINIC_OR_DEPARTMENT_OTHER)
Admission: EM | Admit: 2022-07-02 | Discharge: 2022-07-02 | Disposition: A | Discharge status: Home / Self Care | Payer: Self-pay | Attending: Emergency Medicine | Admitting: Emergency Medicine

## 2022-07-02 ENCOUNTER — Other Ambulatory Visit: Payer: Self-pay

## 2022-07-02 ENCOUNTER — Emergency Department (HOSPITAL_BASED_OUTPATIENT_CLINIC_OR_DEPARTMENT_OTHER): Payer: Self-pay

## 2022-07-02 DIAGNOSIS — R109 Unspecified abdominal pain: Secondary | ICD-10-CM | POA: Insufficient documentation

## 2022-07-02 DIAGNOSIS — E039 Hypothyroidism, unspecified: Secondary | ICD-10-CM | POA: Insufficient documentation

## 2022-07-02 DIAGNOSIS — I10 Essential (primary) hypertension: Secondary | ICD-10-CM | POA: Insufficient documentation

## 2022-07-02 DIAGNOSIS — N200 Calculus of kidney: Secondary | ICD-10-CM

## 2022-07-02 LAB — CBC WITH DIFFERENTIAL/PLATELET
Abs Immature Granulocytes: 0.02 10*3/uL (ref 0.00–0.07)
Basophils Absolute: 0 10*3/uL (ref 0.0–0.1)
Basophils Relative: 0 %
Eosinophils Absolute: 0.1 10*3/uL (ref 0.0–0.5)
Eosinophils Relative: 1 %
HCT: 42.9 % (ref 36.0–46.0)
Hemoglobin: 14.1 g/dL (ref 12.0–15.0)
Immature Granulocytes: 0 %
Lymphocytes Relative: 40 %
Lymphs Abs: 2.5 10*3/uL (ref 0.7–4.0)
MCH: 29.1 pg (ref 26.0–34.0)
MCHC: 32.9 g/dL (ref 30.0–36.0)
MCV: 88.6 fL (ref 80.0–100.0)
Monocytes Absolute: 0.4 10*3/uL (ref 0.1–1.0)
Monocytes Relative: 6 %
Neutro Abs: 3.2 10*3/uL (ref 1.7–7.7)
Neutrophils Relative %: 53 %
Platelets: 240 10*3/uL (ref 150–400)
RBC: 4.84 MIL/uL (ref 3.87–5.11)
RDW: 13.6 % (ref 11.5–15.5)
WBC: 6.1 10*3/uL (ref 4.0–10.5)
nRBC: 0 % (ref 0.0–0.2)

## 2022-07-02 LAB — COMPREHENSIVE METABOLIC PANEL
ALT: 10 U/L (ref 0–44)
AST: 15 U/L (ref 15–41)
Albumin: 3.7 g/dL (ref 3.5–5.0)
Alkaline Phosphatase: 73 U/L (ref 38–126)
Anion gap: 5 (ref 5–15)
BUN: 13 mg/dL (ref 6–20)
CO2: 25 mmol/L (ref 22–32)
Calcium: 8.9 mg/dL (ref 8.9–10.3)
Chloride: 108 mmol/L (ref 98–111)
Creatinine, Ser: 1.1 mg/dL — ABNORMAL HIGH (ref 0.44–1.00)
GFR, Estimated: >60 mL/min (ref >60)
Glucose, Bld: 95 mg/dL (ref 70–99)
Potassium: 3.8 mmol/L (ref 3.5–5.1)
Sodium: 138 mmol/L (ref 135–145)
Total Bilirubin: 0.4 mg/dL (ref 0.3–1.2)
Total Protein: 7 g/dL (ref 6.5–8.1)

## 2022-07-02 LAB — URINALYSIS, ROUTINE W REFLEX MICROSCOPIC
Bilirubin Urine: NEGATIVE
Glucose, UA: NEGATIVE mg/dL
Hgb urine dipstick: NEGATIVE
Ketones, ur: NEGATIVE mg/dL
Leukocytes,Ua: NEGATIVE
Nitrite: NEGATIVE
Protein, ur: NEGATIVE mg/dL
Specific Gravity, Urine: 1.02 (ref 1.005–1.030)
pH: 6 (ref 5.0–8.0)

## 2022-07-02 LAB — PREGNANCY, URINE: Preg Test, Ur: NEGATIVE

## 2022-07-02 MED ORDER — KETOROLAC TROMETHAMINE 15 MG/ML IJ SOLN
15.0000 mg | Freq: Once | INTRAMUSCULAR | Status: AC
  Administered 2022-07-02: 15 mg via INTRAVENOUS
  Filled 2022-07-02: qty 1

## 2022-07-02 MED ORDER — BACLOFEN 10 MG PO TABS: 10.0000 mg | ORAL_TABLET | Freq: Three times a day (TID) | ORAL | 0 refills | Status: AC

## 2022-07-02 NOTE — ED Triage Notes (Signed)
Patient presents to ED via POV from home. Here with 2 week history of left flank pain. History of kidney stones. Hypertensive in triage. Patient reports she is suppose to be on medication for same but she has not taken it.

## 2022-07-02 NOTE — ED Provider Notes (Signed)
[facility] EMERGENCY DEPARTMENT Provider Note   CSN: [id number] Arrival date & time: 07/02/22  1049     History  Chief Complaint  Patient presents with   Flank Pain    Tonya Brennan is a 36 y.o. female with past medical history of hyperthyroidism, hypertension, kidney stone s/p R stone removal 05/2019 presenting to the emergency department for evaluation of low back pain.  Patient states she has had progressively worsening right low back pain since last week.  The pain initially was intermittent, now more consistent.  She describes it as sharp, nonradiating, worse with movement, better with rest.  She has tried Tylenol, ibuprofen for pain with no improvement.  She had a history of kidney stone on the right side which was removed in September 2020.  States the pain she has been experiencing is similar to the pain she had when she had a kidney stone on the right side.  She reports shortness of breath, increasing urinary frequency and urgency.  Denies any blood in her urine.  Denies any chest pain, headache, dizziness, bowel changes.  Has history of hypertension. She was put on Norvasc 10 mg daily however she has been noncompliant with her medication in the last 6 months.   Flank Pain       Home Medications Prior to Admission medications   Medication Sig Start Date End Date Taking? Authorizing Provider  amLODipine (NORVASC) 10 MG tablet Take 10 mg by mouth daily.    [provider]      Allergies    Percocet [oxycodone-acetaminophen] and Theraflu max-d cold & flu [pseudoephedrine-dm-gg-apap]    Review of Systems   Review of Systems  Genitourinary:  Positive for flank pain, frequency and urgency.  Musculoskeletal:  Positive for back pain.    Physical Exam Updated Vital Signs BP (!) 200/160   Pulse 97   Temp 98.5 F (36.9 C) (Oral)   Resp 19   Ht '5\' 6"'$  (1.676 m)   Wt 86.2 kg   SpO2 100%   BMI 30.67 kg/m  Physical Exam Vitals and nursing note reviewed.   Constitutional:      Appearance: Normal appearance.  HENT:     Head: Normocephalic and atraumatic.     Mouth/Throat:     Mouth: Mucous membranes are moist.  Eyes:     General: No scleral icterus. Cardiovascular:     Rate and Rhythm: Normal rate and regular rhythm.     Pulses: Normal pulses.     Heart sounds: Normal heart sounds.  Pulmonary:     Effort: Pulmonary effort is normal.     Breath sounds: Normal breath sounds.  Abdominal:     General: Abdomen is flat.     Palpations: Abdomen is soft.     Tenderness: There is no abdominal tenderness. There is left CVA tenderness.  Musculoskeletal:        General: No deformity.  Skin:    General: Skin is warm.     Findings: No rash.  Neurological:     General: No focal deficit present.     Mental Status: She is alert.  Psychiatric:        Mood and Affect: Mood normal.     ED Results / Procedures / Treatments   Labs (all labs ordered are listed, but only abnormal results are displayed) Labs Reviewed - No data to display  EKG None  Radiology No results found.  Procedures Procedures    Medications Ordered in ED Medications -  No data to display  ED Course/ Medical Decision Making/ A&P                           Medical Decision Making Amount and/or Complexity of Data Reviewed Labs: ordered. Radiology: ordered.  Risk Prescription drug management.   This patient presents to the ED for concern of lower back pain, this involves an extensive number of treatment options, and is a complaint that carries with it a high risk of complications and morbidity.  The differential diagnosis includes kidney stone, pyelonephritis, cystitis, urinary retention, diverticulitis, ovarian torsion, ovarian cyst.   Co morbidities that complicate the patient evaluation  See HPI   Additional history obtained:  Additional history obtained from EMR External records from outside source obtained and reviewed including Care  Everywhere/External Records and Primary Care Documents  Lab Tests:  I Ordered, and personally interpreted labs.  The pertinent results include:   No leukocytosis noted.  No evidence of anemia.  Platelets within normal range.  No electrolyte abnormalities noted.  Renal function within normal limits.  No transaminitis noted. UA significant for no acute abnormalities.  Urine pregnancy test negative   Imaging Studies ordered:  I ordered imaging studies including CT renal stone I independently visualized and interpreted imaging which showed nonobstructive 11 mm left renal calculus. I agree with the radiologist interpretation   Cardiac Monitoring: / EKG:  The patient was maintained on a cardiac monitor.  I personally viewed and interpreted the cardiac monitored which showed an underlying rhythm of: sinus rhythm   Consultations Obtained:  I have discussed the case with the attending Dr. Tyrone Nine for evaluation and management plan.   Problem List / ED Course / Critical interventions / Medication management  Low back pain Vitals signs significant for blood pressure 188/150. Otherwise within normal range and stable throughout visit. Laboratory/imaging studies significant for: See above On physical examination, patient is afebrile, appears uncomfortable in bed, blood pressure in the 180s to 200s.  Positive left CVA tenderness.  CT renal study showed an 11 mm nonobstructive renal calculus.  Pain is under control with Toradol.  I discussed case with the attending Dr. Tyrone Nine who recommended discharge with pain control and follow-up with urology.  Doubt pyelonephritis, PID.  Patient is stable to discharge.  I ordered medication including Toradol for pain.  Patient is safe for discharge.  I will refer her to urology for further evaluation and management. Patient's BP runs high in the ED in the 180s-200s.  She has been noncompliant with her blood pressure medication in the last 6 months.  I recommended close  follow-up with primary care provider for BP management. Reevaluation of the patient after these medicines showed that the patient improved I have reviewed the patients home medicines and have made adjustments as needed    Social Determinants of Health:  NA   Test / Admission / Dispo - Considered:  Continued outpatient therapy with the same. Follow-up with urology and PCP recommended for reevaluation of symptoms. Treatment plan discussed with patient.  Pt acknowledged understanding was agreeable to the plan. Worrisome signs and symptoms were discussed with patient, and patient acknowledged understanding to return to the ED if they noticed these signs and symptoms. Patient was stable upon discharge.          Final Clinical Impression(s) / ED Diagnoses Final diagnoses:  None    Rx / DC Orders ED Discharge Orders     None  Rex Kras, [place] 07/02/22 Elenora Gamma, DO 07/03/22 [phone]

## 2022-07-02 NOTE — Discharge Instructions (Addendum)
Your back pain is most likely due to a muscular strain.  There is been a lot of research on back pain, unfortunately the only thing that seems to really help is Tylenol and ibuprofen.  Relative rest is also important to not lift greater than 10 pounds bending or twisting at the waist.  Please follow-up with your family physician.  The other thing that really seems to benefit patients is physical therapy which your doctor may send you for.  Please return to the emergency department for new numbness or weakness to your arms or legs. Difficulty with urinating or urinating or pooping on yourself.  Also if you cannot feel toilet paper when you wipe or get a fever.   Take 4 over the counter ibuprofen tablets 3 times a day or 2 over-the-counter naproxen tablets twice a day for pain. Also take tylenol '1000mg'$ (2 extra strength) four times a day.  Try stretches [email or web address]

## 2022-07-03 LAB — URINE CULTURE: Culture: <10000 — AB

## 2022-08-10 ENCOUNTER — Emergency Department (HOSPITAL_BASED_OUTPATIENT_CLINIC_OR_DEPARTMENT_OTHER)
Admission: EM | Admit: 2022-08-10 | Discharge: 2022-08-10 | Disposition: A | Discharge status: Home / Self Care | Payer: Self-pay | Attending: Emergency Medicine | Admitting: Emergency Medicine

## 2022-08-10 ENCOUNTER — Encounter (HOSPITAL_BASED_OUTPATIENT_CLINIC_OR_DEPARTMENT_OTHER): Payer: Self-pay | Admitting: Emergency Medicine

## 2022-08-10 ENCOUNTER — Emergency Department (HOSPITAL_BASED_OUTPATIENT_CLINIC_OR_DEPARTMENT_OTHER): Payer: Self-pay

## 2022-08-10 DIAGNOSIS — M25462 Effusion, left knee: Secondary | ICD-10-CM | POA: Insufficient documentation

## 2022-08-10 LAB — SYNOVIAL CELL COUNT + DIFF, W/ CRYSTALS
Crystals, Fluid: NONE SEEN
Eosinophils-Synovial: 0 % (ref 0–1)
Lymphocytes-Synovial Fld: 4 % (ref 0–20)
Monocyte-Macrophage-Synovial Fluid: 5 % — ABNORMAL LOW (ref 50–90)
Neutrophil, Synovial: 91 % — ABNORMAL HIGH (ref 0–25)
WBC, Synovial: 48300 /mm3 — ABNORMAL HIGH (ref 0–200)

## 2022-08-10 MED ORDER — LIDOCAINE HCL (PF) 1 % IJ SOLN
INTRAMUSCULAR | Status: AC
  Filled 2022-08-10: qty 5

## 2022-08-10 MED ORDER — NAPROXEN 500 MG PO TABS: 500.0000 mg | ORAL_TABLET | Freq: Two times a day (BID) | ORAL | 0 refills | Status: AC | PRN

## 2022-08-10 MED ORDER — LIDOCAINE HCL (PF) 1 % IJ SOLN
10.0000 mL | Freq: Once | INTRAMUSCULAR | Status: AC
  Administered 2022-08-10: 10 mL

## 2022-08-10 MED ORDER — LIDOCAINE HCL (PF) 1 % IJ SOLN
20.0000 mL | Freq: Once | INTRAMUSCULAR | Status: AC
  Administered 2022-08-10: 20 mL
  Filled 2022-08-10: qty 20

## 2022-08-10 MED ORDER — NAPROXEN 250 MG PO TABS
500.0000 mg | ORAL_TABLET | Freq: Once | ORAL | Status: AC
  Administered 2022-08-10: 500 mg via ORAL
  Filled 2022-08-10: qty 2

## 2022-08-10 MED ORDER — AMLODIPINE BESYLATE 5 MG PO TABS: 2.5000 mg | ORAL_TABLET | Freq: Once | ORAL | Status: DC

## 2022-08-10 NOTE — ED Notes (Signed)
ED Provider at bedside. 

## 2022-08-10 NOTE — Discharge Instructions (Addendum)
Please follow-up with your primary care doctor.  If your knee becomes red, swollen, extremely painful please return to the ER.  Take the naproxen, until your flare is over.

## 2022-08-10 NOTE — ED Provider Notes (Signed)
[facility] EMERGENCY DEPARTMENT Provider Note   CSN: [id number] Arrival date & time: 08/10/22  5974     History  Chief Complaint  Patient presents with   Knee Pain    Arnika Larzelere is a 36 y.o. female, no pertinent past medical history, who presents to the ED secondary to left knee swelling for the past week.  She states that she has not had any trauma, but recently moved, has been going up the stairs quite often, and lifting heavy things.  States that her knee was initially mildly swollen, but has been more swollen over time.  Denies any fever, chills no history of gout.  No history of an artificial knee.  No wounds to the knee.  Notes that this is happened before and she had to have it drained.     Home Medications Prior to Admission medications   Medication Sig Start Date End Date Taking? Authorizing Provider  amLODipine (NORVASC) 5 MG tablet Take 5 mg by mouth daily. Unsure of dose   Yes [provider]  amoxicillin (AMOXIL) 500 MG capsule Take 500 mg by mouth 3 (three) times daily.   Yes [provider]  naproxen (NAPROSYN) 500 MG tablet Take 1 tablet (500 mg total) by mouth 2 (two) times daily as needed. 08/10/22  Yes Elijiah Mickley L, PA  baclofen (LIORESAL) 10 MG tablet Take 1 tablet (10 mg total) by mouth 3 (three) times daily. 07/02/22   Rex Kras, PA  losartan (COZAAR) 25 MG tablet Take 25 mg by mouth daily. 07/18/20   [provider]      Allergies    Percocet [oxycodone-acetaminophen] and Theraflu max-d cold & flu [pseudoephedrine-dm-gg-apap]    Review of Systems   Review of Systems  Musculoskeletal:        +Knee pain, L  Skin:  Negative for rash.    Physical Exam Updated Vital Signs BP (!) 175/119   Pulse 92   Temp 98 F (36.7 C)   Resp 18   SpO2 100%  Physical Exam Vitals and nursing note reviewed.  Constitutional:      General: She is not in acute distress.    Appearance: She is well-developed.  HENT:     Head:  Normocephalic and atraumatic.  Eyes:     Conjunctiva/sclera: Conjunctivae normal.  Cardiovascular:     Rate and Rhythm: Normal rate and regular rhythm.     Heart sounds: No murmur heard. Pulmonary:     Effort: Pulmonary effort is normal. No respiratory distress.     Breath sounds: Normal breath sounds.  Abdominal:     Palpations: Abdomen is soft.     Tenderness: There is no abdominal tenderness.  Musculoskeletal:        General: No swelling.     Cervical back: Neck supple.     Comments: Left  Knee: Diffuse ttp of anterior knee. An effusion is/I present.  Negative anterior and posterior drawer. Negative Mcmurray's. +Patellar stability. Negative valgus and varus stress test.. Extension and flexion intact. No sensory deficits.    Skin:    General: Skin is warm and dry.     Capillary Refill: Capillary refill takes less than 2 seconds.  Neurological:     Mental Status: She is alert.  Psychiatric:        Mood and Affect: Mood normal.     ED Results / Procedures / Treatments   Labs (all labs ordered are listed, but only abnormal results are displayed) Labs Reviewed  BODY FLUID CULTURE W GRAM STAIN  GLUCOSE, BODY FLUID OTHER            PROTEIN, BODY FLUID (OTHER)  SYNOVIAL CELL COUNT + DIFF, W/ CRYSTALS  URIC ACID, BODY FLUID    EKG None  Radiology DG Knee Complete 4 Views Left  Result Date: 08/10/2022 CLINICAL DATA:  One-week history of swelling and pain in the left knee. No injury. EXAM: LEFT KNEE - COMPLETE 4 VIEW COMPARISON:  None Available. FINDINGS: There are no findings of fracture or dislocation. Large joint effusion. Chondrocalcinosis of the knee. Soft tissues are unremarkable. IMPRESSION: 1. Large joint effusion. 2. 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 Electronically Signed   By: Darrin Nipper M.D.   On: 08/10/2022 09:45    Procedures .Joint Aspiration/Arthrocentesis  Date/Time: 08/10/2022 10:56 AM  Performed by: Gareth Morgan,  MD Authorized by: Gareth Morgan, MD   Consent:    Consent obtained:  Verbal   Consent given by:  Patient   Risks, benefits, and alternatives were discussed: yes     Risks discussed:  Bleeding, infection, pain, nerve damage, incomplete drainage and poor cosmetic result   Alternatives discussed:  No treatment and alternative treatment Universal protocol:    Procedure explained and questions answered to patient or proxy's satisfaction: yes     Imaging studies available: yes     Site/side marked: yes     Patient identity confirmed:  Verbally with patient Location:    Location:  Knee   Knee:  L knee Anesthesia:    Anesthesia method:  Local infiltration   Local anesthetic:  Lidocaine 1% w/o epi Procedure details:    Preparation: Patient was prepped and draped in usual sterile fashion     Needle gauge:  18 G   Ultrasound guidance: no     Approach:  Lateral   Aspirate amount:  70 mL   Aspirate characteristics:  Cloudy   Steroid injected: no     Specimen collected: yes   Post-procedure details:    Dressing:  Adhesive bandage   Procedure completion:  Tolerated     Medications Ordered in ED Medications  naproxen (NAPROSYN) tablet 500 mg (has no administration in time range)  amLODipine (NORVASC) tablet 2.5 mg (has no administration in time range)  lidocaine (PF) (XYLOCAINE) 1 % injection 20 mL (20 mLs Infiltration Given by Other 08/10/22 0959)  lidocaine (PF) (XYLOCAINE) 1 % injection 10 mL (10 mLs Infiltration Given by Other 08/10/22 1035)    ED Course/ Medical Decision Making/ A&P                           Medical Decision Making Patient is a 36 year old female, here for knee pain, she states been going about a week.  Denies any trauma, states that she has been going up stairs frequently, and lifting things.  Denies any fevers or chills.  Also notes that she is currently being treated for a blood pressure medication and took it right before she came here.  Denies any chest  pain, headache, shortness of breath.  She is insisting that her knee be drained and she states she will go to multiple other places for her to be drained.  Amount and/or Complexity of Data Reviewed Labs: ordered. Radiology: ordered.    Details: X-ray shows large knee effusion, possible CPPD Discussion of management or test interpretation with external provider(s): Attempted to drain area, discussed risk and benefits associated  with this, the patient and she chose to proceed, attempted to drain while using sterile technique with no success, thus discussed with Dr. Billy Fischer, who was able to remove about 70 mL of fluid from her knee.  We discussed her increased risk of infection with patient, who voiced understanding.  She understands that she will need to return to the ER if she has any kind of infectious symptoms, fever, chills, worsening pain, redness, swelling of the left knee.  Fluid results were sent out, and she understands that she will need to follow-up with her primary care doctor for this.  Placed on naproxen for pain control, and treatment of pseudogout.  Fluid was slightly cloudy, but mostly clear, likely secondary to inflammatory, however fluid sent off.  She has no signs of septic arthritis including fever, chills, redness, of the site.  She is not an IV drug user.  Risk Prescription drug management.   Final Clinical Impression(s) / ED Diagnoses Final diagnoses:  Knee effusion, left    Rx / DC Orders ED Discharge Orders          Ordered    naproxen (NAPROSYN) 500 MG tablet  2 times daily PRN        08/10/22 1053              Ebb Carelock, [place], [place] 08/10/22 1230    Gareth Morgan, MD 08/10/22 2116

## 2022-08-10 NOTE — ED Triage Notes (Signed)
Pt reports her L knee has been swollen for the past week. Has had similar symptoms before and has had to have knee drained. No acute injury, but has been going up and down steps moving. Pt ambulatory.

## 2022-08-11 LAB — GLUCOSE, BODY FLUID OTHER: Glucose, Body Fluid Other: 54 mg/dL

## 2022-08-13 LAB — BODY FLUID CULTURE W GRAM STAIN: Culture: NO GROWTH

## 2022-08-23 LAB — PROTEIN, BODY FLUID (OTHER): Total Protein, Body Fluid Other: 3.4 g/dL

## 2022-08-28 LAB — URIC ACID, BODY FLUID

## 2024-04-28 ENCOUNTER — Other Ambulatory Visit: Payer: Self-pay | Admitting: Physician Assistant

## 2024-04-28 DIAGNOSIS — N926 Irregular menstruation, unspecified: Secondary | ICD-10-CM

## 2024-06-24 ENCOUNTER — Ambulatory Visit: Payer: Self-pay | Admitting: Dermatology

## 2024-07-15 ENCOUNTER — Encounter: Payer: Self-pay | Admitting: Obstetrics

## 2024-07-15 ENCOUNTER — Ambulatory Visit: Admitting: Obstetrics

## 2024-07-15 VITALS — BP 124/94 | HR 86 | Ht 66.0 in | Wt 193.2 lb

## 2024-07-15 DIAGNOSIS — E28319 Asymptomatic premature menopause: Secondary | ICD-10-CM

## 2024-07-15 DIAGNOSIS — F3289 Other specified depressive episodes: Secondary | ICD-10-CM | POA: Diagnosis not present

## 2024-07-15 DIAGNOSIS — N838 Other noninflammatory disorders of ovary, fallopian tube and broad ligament: Secondary | ICD-10-CM

## 2024-07-15 DIAGNOSIS — Z9889 Other specified postprocedural states: Secondary | ICD-10-CM | POA: Diagnosis not present

## 2024-07-15 DIAGNOSIS — N951 Menopausal and female climacteric states: Secondary | ICD-10-CM | POA: Diagnosis not present

## 2024-07-15 DIAGNOSIS — E2839 Other primary ovarian failure: Secondary | ICD-10-CM

## 2024-07-15 DIAGNOSIS — N912 Amenorrhea, unspecified: Secondary | ICD-10-CM

## 2024-07-15 DIAGNOSIS — Z86018 Personal history of other benign neoplasm: Secondary | ICD-10-CM

## 2024-07-15 NOTE — Progress Notes (Signed)
 Pt presents for abnormal periods. Pt . States that her last period was in 2024, a year ago. Pt last pap was on 04/12/2024

## 2024-07-15 NOTE — Progress Notes (Signed)
 Patient ID: Tonya Brennan, female   DOB: November 05, 1985, 38 y.o.   MRN: [id number]  Chief Complaint  Patient presents with   Menstrual Problem    HPI Brigitta Pricer is a 38 y.o. female.  No period for > 1 year and severe hot flashes. HPI  Past Medical History:  Diagnosis Date   History of kidney stones    Hypertension    Right ovarian cyst    Thyroid  disease     Past Surgical History:  Procedure Laterality Date   CYSTOSCOPY W/ URETERAL STENT PLACEMENT Right 06/03/2019   Procedure: CYSTOSCOPY WITH RETROGRADE PYELOGRAM/URETERAL STENT PLACEMENT/STONE REMOVAL WITH BASKET;  Surgeon: Sherrilee Belvie CROME, MD;  Location: [facility];  Service: Urology;  Laterality: Right;   IR NEPHROSTOGRAM RIGHT THRU EXISTING ACCESS  08/14/2018   IR URETERAL STENT PLACEMENT EXISTING ACCESS RIGHT  08/19/2018   IR URETERAL STENT RIGHT NEW ACCESS W/O SEP NEPHROSTOMY CATH  08/10/2018   LAPAROSCOPIC OVARIAN CYSTECTOMY Right 06/13/2020   Procedure: LAPAROSCOPIC OVARIAN CYSTECTOMY;  Surgeon: Bonnielee Rayleen POUR, MD;  Location: [facility];  Service: Gynecology;  Laterality: Right;   NEPHROLITHOTOMY Right 08/11/2018   Procedure: NEPHROLITHOTOMY PERCUTANEOUS;  Surgeon: Watt Rush, MD;  Location: [facility];  Service: Urology;  Laterality: Right;   PARATHYROIDECTOMY Left 04/25/2020   Procedure: LEFT PARATHYROIDECTOMY;  Surgeon: Karis Clunes, MD;  Location: [facility];  Service: ENT;  Laterality: Left;    History reviewed. No pertinent family history.  Social History Social History   Tobacco Use   Smoking status: Former    Types: Cigarettes   Smokeless tobacco: Never   Tobacco comments:    quit 15 yrs ago  Vaping Use   Vaping status: Never Used  Substance Use Topics   Alcohol use: Not Currently   Drug use: Yes    Types: Marijuana    Comment: daily use     Allergies  Allergen Reactions   Percocet [Oxycodone -Acetaminophen ] Itching and Nausea And Vomiting   Theraflu Max-D Cold & Flu  [Pseudoephedrine-Dm-Gg-Apap] Nausea And Vomiting    Current Outpatient Medications  Medication Sig Dispense Refill   amLODipine  (NORVASC ) 5 MG tablet Take 5 mg by mouth daily. Unsure of dose     naproxen  (NAPROSYN ) 500 MG tablet Take 1 tablet (500 mg total) by mouth 2 (two) times daily as needed. 30 tablet 0   amoxicillin  (AMOXIL ) 500 MG capsule Take 500 mg by mouth 3 (three) times daily. (Patient not taking: Reported on 07/15/2024)     baclofen  (LIORESAL ) 10 MG tablet Take 1 tablet (10 mg total) by mouth 3 (three) times daily. (Patient not taking: Reported on 07/15/2024) 30 each 0   losartan (COZAAR) 25 MG tablet Take 25 mg by mouth daily. (Patient not taking: Reported on 07/15/2024)     No current facility-administered medications for this visit.    Review of Systems Review of Systems Constitutional: negative for fatigue and weight loss Respiratory: negative for cough and wheezing Cardiovascular: negative for chest pain, fatigue and palpitations Gastrointestinal: negative for abdominal pain and change in bowel habits Genitourinary: positive for amenorrhea and hot flashes Integument/breast: negative for nipple discharge Musculoskeletal:negative for myalgias Neurological: negative for gait problems and tremors Behavioral/Psych:  positive for depression.  negative for abusive relationship Endocrine: negative for temperature intolerance      Blood pressure (!) 124/94, pulse 86, height 5' 6 (1.676 m), weight 193 lb 3.2 oz (87.6 kg).  Physical Exam Physical Exam General:   Alert and no distress  Skin:  no rash or abnormalities  Lungs:   clear to auscultation bilaterally  Heart:   regular rate and rhythm, S1, S2 normal, no murmur, click, rub or gallop  Breasts:   normal without suspicious masses, skin or nipple changes or axillary nodes  Abdomen:  normal findings: no organomegaly, soft, non-tender and no hernia  Pelvis:  External genitalia: normal general appearance Urinary system:  urethral meatus normal and bladder without fullness, nontender Vaginal: normal without tenderness, induration or masses Cervix: normal appearance Adnexa: normal bimanual exam Uterus: anteverted and non-tender, normal size    I have spent a total of 20 minutes of face-to-face time, excluding clinical staff time, reviewing notes and preparing to see patient, ordering tests and/or medications, and counseling the patient.   Data Reviewed Labs  Assessment     1. Amenorrhea (Primary) Rx: - TSH - FSH/LH  2. Early menopause occurring in patient age younger than 45 years  3. Ovarian mass, right Rx: - US  PELVIC COMPLETE WITH TRANSVAGINAL; Future  4. History of dermoid cyst excision  5. 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  Amenorrhea.  Early menopause    Preferred imaging location?:   [facility] Ultrasound   TSH   FSH/LH     CARLIN RONAL CENTERS, MD, FACOG Attending Obstetrician & Gynecologist, [facility] for [facility], [facility], [place] 07/15/2024

## 2024-07-16 LAB — FSH/LH
FSH: 102 m[IU]/mL
LH: 84.6 m[IU]/mL

## 2024-07-16 LAB — TSH: TSH: 1.08 u[IU]/mL (ref 0.450–4.500)

## 2024-07-19 ENCOUNTER — Ambulatory Visit (HOSPITAL_COMMUNITY)
Admission: RE | Admit: 2024-07-19 | Discharge: 2024-07-19 | Disposition: A | Discharge status: Home / Self Care | Source: Ambulatory Visit | Attending: Obstetrics | Admitting: Obstetrics

## 2024-07-19 DIAGNOSIS — N838 Other noninflammatory disorders of ovary, fallopian tube and broad ligament: Secondary | ICD-10-CM | POA: Diagnosis present

## 2024-07-21 ENCOUNTER — Ambulatory Visit: Payer: Self-pay | Admitting: Obstetrics

## 2024-08-11 ENCOUNTER — Ambulatory Visit (INDEPENDENT_AMBULATORY_CARE_PROVIDER_SITE_OTHER): Admitting: Dermatology

## 2024-08-11 ENCOUNTER — Encounter: Payer: Self-pay | Admitting: Dermatology

## 2024-08-11 VITALS — BP 113/85 | HR 90

## 2024-08-11 DIAGNOSIS — D492 Neoplasm of unspecified behavior of bone, soft tissue, and skin: Secondary | ICD-10-CM

## 2024-08-11 NOTE — Patient Instructions (Signed)

## 2024-08-11 NOTE — Progress Notes (Signed)
   New Patient Visit   Subjective  Tonya Brennan is a 38 y.o. female who presents for the following: Spot Check  Patient states she has spot located at the right Upper eye Lid that she would like to have examined. Patient reports the areas have been there for 1 year. She reports the areas can  bothersome.Patient rates irritation 4 out of 10. She states that the areas has gotten bigger in size. Patient reports she has not previously been treated for these areas. Patient denies Hx of bx. Patient denies family history of skin cancer(s).  The patient has spots, moles and lesions to be evaluated, some may be new or changing.  Patient report she is not actively pregnant, trying to conceive or Nursing.  The following portions of the chart were reviewed this encounter and updated as appropriate: medications, allergies, medical history  Review of Systems:  No other skin or systemic complaints except as noted in HPI or Assessment and Plan.  Objective  Well appearing patient in no apparent distress; mood and affect are within normal limits.  A focused examination was performed of the following areas: Right Upper Eye Lid  Relevant exam findings are noted in the Assessment and Plan.      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  I, Jetta Ager, am acting as scribe for RUFUS CHRISTELLA HOLY, MD.  Documentation: I have reviewed the above documentation for accuracy and completeness, and I agree with the above.  RUFUS CHRISTELLA HOLY,  MD

## 2024-08-16 ENCOUNTER — Other Ambulatory Visit: Payer: Self-pay

## 2024-08-16 DIAGNOSIS — N838 Other noninflammatory disorders of ovary, fallopian tube and broad ligament: Secondary | ICD-10-CM

## 2024-08-17 NOTE — Addendum Note (Signed)
 Addended by: ROMONA DUWAINE SAILOR on: 08/17/2024 12:01 PM   Modules accepted: Orders

## 2024-08-26 ENCOUNTER — Ambulatory Visit: Admitting: Obstetrics

## 2024-08-27 ENCOUNTER — Telehealth: Payer: Self-pay

## 2024-08-27 ENCOUNTER — Ambulatory Visit: Admitting: Obstetrics & Gynecology

## 2024-08-30 ENCOUNTER — Ambulatory Visit (HOSPITAL_COMMUNITY)
Admission: RE | Admit: 2024-08-30 | Discharge: 2024-08-30 | Disposition: A | Discharge status: Home / Self Care | Source: Ambulatory Visit | Attending: Obstetrics | Admitting: Obstetrics

## 2024-08-30 DIAGNOSIS — N838 Other noninflammatory disorders of ovary, fallopian tube and broad ligament: Secondary | ICD-10-CM

## 2024-08-30 MED ORDER — GADOBUTROL 1 MMOL/ML IV SOLN
8.0000 mL | Freq: Once | INTRAVENOUS | Status: AC | PRN
  Administered 2024-08-30: 8 mL via INTRAVENOUS

## 2024-09-08 ENCOUNTER — Ambulatory Visit: Admitting: Obstetrics & Gynecology

## 2024-09-08 VITALS — BP 137/93 | HR 69 | Wt 189.0 lb

## 2024-09-08 DIAGNOSIS — N912 Amenorrhea, unspecified: Secondary | ICD-10-CM | POA: Diagnosis not present

## 2024-09-08 DIAGNOSIS — N951 Menopausal and female climacteric states: Secondary | ICD-10-CM

## 2024-09-08 MED ORDER — PREMPRO 0.3-1.5 MG PO TABS: 1.0000 | ORAL_TABLET | Freq: Every day | ORAL | 11 refills | Status: AC

## 2024-09-08 NOTE — Progress Notes (Signed)
 Patient ID: Kacie Huxtable, female   DOB: 03/23/1986, 38 y.o.   MRN: [id number]  Chief Complaint  Patient presents with   Follow-up    HPI Blair Mesina is a 38 y.o. female. No obstetric history on file. G0 Amenorrhea for <1y and VMS. F/U labs and US  HPI  Past Medical History:  Diagnosis Date   History of kidney stones    Hypertension    Right ovarian cyst    Thyroid  disease     Past Surgical History:  Procedure Laterality Date   CYSTOSCOPY W/ URETERAL STENT PLACEMENT Right 06/03/2019   Procedure: CYSTOSCOPY WITH RETROGRADE PYELOGRAM/URETERAL STENT PLACEMENT/STONE REMOVAL WITH BASKET;  Surgeon: Sherrilee Belvie CROME, MD;  Location: [facility];  Service: Urology;  Laterality: Right;   IR NEPHROSTOGRAM RIGHT THRU EXISTING ACCESS  08/14/2018   IR URETERAL STENT PLACEMENT EXISTING ACCESS RIGHT  08/19/2018   IR URETERAL STENT RIGHT NEW ACCESS W/O SEP NEPHROSTOMY CATH  08/10/2018   LAPAROSCOPIC OVARIAN CYSTECTOMY Right 06/13/2020   Procedure: LAPAROSCOPIC OVARIAN CYSTECTOMY;  Surgeon: Bonnielee Rayleen POUR, MD;  Location: [facility];  Service: Gynecology;  Laterality: Right;   NEPHROLITHOTOMY Right 08/11/2018   Procedure: NEPHROLITHOTOMY PERCUTANEOUS;  Surgeon: Watt Rush, MD;  Location: [facility];  Service: Urology;  Laterality: Right;   PARATHYROIDECTOMY Left 04/25/2020   Procedure: LEFT PARATHYROIDECTOMY;  Surgeon: Karis Clunes, MD;  Location: [facility];  Service: ENT;  Laterality: Left;    No family history on file.  Social History Social History[1]  Allergies[2]  Current Outpatient Medications  Medication Sig Dispense Refill   amLODipine  (NORVASC ) 5 MG tablet Take 5 mg by mouth daily. Unsure of dose     amoxicillin  (AMOXIL ) 500 MG capsule Take 500 mg by mouth 3 (three) times daily. (Patient not taking: Reported on 08/11/2024)     baclofen  (LIORESAL ) 10 MG tablet Take 1 tablet (10 mg total) by mouth 3 (three) times daily. (Patient not taking: Reported on  08/11/2024) 30 each 0   estrogen, conjugated,-medroxyprogesterone (PREMPRO ) 0.3-1.5 MG tablet Take 1 tablet by mouth daily. 30 tablet 11   losartan (COZAAR) 25 MG tablet Take 25 mg by mouth daily. (Patient not taking: Reported on 08/11/2024)     naproxen  (NAPROSYN ) 500 MG tablet Take 1 tablet (500 mg total) by mouth 2 (two) times daily as needed. (Patient not taking: Reported on 08/11/2024) 30 tablet 0   No current facility-administered medications for this visit.    Review of Systems Review of Systems  Respiratory: Negative.    Cardiovascular: Negative.   Gastrointestinal: Negative.   Endocrine:       VMS  Psychiatric/Behavioral:  Positive for sleep disturbance.     Blood pressure (!) 137/93, pulse 69, weight 189 lb (85.7 kg).  Physical Exam Physical Exam Vitals and nursing note reviewed.  Constitutional:      Appearance: Normal appearance.  Cardiovascular:     Rate and Rhythm: Normal rate.  Pulmonary:     Effort: Pulmonary effort is normal.  Neurological:     General: No focal deficit present.     Mental Status: She is alert.  Psychiatric:        Mood and Affect: Mood normal.        Behavior: Behavior normal.     Data Reviewed  Narrative & Impression  EXAM: US  Pelvis, Complete Transvaginal and Transabdominal without Doppler   CLINICAL HISTORY: Solid right ovarian mass, amenorrhea, early menopause.   TECHNIQUE: Transabdominal and transvaginal pelvic duplex ultrasound using B-mode/gray scaled  imaging without Doppler spectral analysis and color flow was obtained.   COMPARISON: CT on 07/02/22   FINDINGS:   UTERUS: Uterus measures 6.4 x 2.4 x 3.8 cm. A 1.2 cm fibroid is noted in the uterine fundus.   ENDOMETRIAL STRIPE: Endometrial stripe measures 1.4 cm and is heterogeneous in appearance.   RIGHT OVARY: Right ovary measures 1.0 x 1.0 x 0.9 cm. A predominantly solid lesion is seen in the right ovary. The solid component is hyperechoic but shows no  acoustic shadowing typically seen with fat .   LEFT OVARY: Left ovary is not visualized.   FREE FLUID: No free fluid.   IMPRESSION: 1. 1 cm predominantly solid right ovarian mass with nonspecific features. Recommend pelvic MRI with IV contrast for further characterization. 2. Thickened and heterogeneous endometrium measuring 1.4 cm which is abnormal in a postmenopausal female; endometrial sampling should be considered . 3. Small uterine fibroid measuring 1.2 cm in the fundus.   Electronically signed by: Norleen Kil MD 07/21/2024 09:24 AM EDT RP Workstation: [id number]        Component Ref Range & Units (hover) 1 mo ago  LH 84.6  Comment:                      Adult Female              Range                       Follicular phase      2.4 -  12.6                       Ovulation phase      14.0 -  95.6                       Luteal phase          1.0 -  11.4                       Postmenopausal        7.7 -  58.5  FSH 102.0  Comment:                      Adult Female             Range                       Follicular phase      3.5 -  12.5                       Ovulation phase       4.7 -  21.5                       Luteal phase          1.7 -   7.7                       Postmenopausal       25.8 - 134.8  Resulting Agency [facility]         Assessment Postmenopausal with 1 cm endometrium no bleeding Needs HRT for VMS   Plan Meds ordered this encounter  Medications   estrogen, conjugated,-medroxyprogesterone (PREMPRO ) 0.3-1.5 MG tablet    Sig: Take 1 tablet by  mouth daily.    Dispense:  30 tablet    Refill:  11   F/u in 4 mo and consider repeat US  Patient may have some VB after starting HRT    Lynwood Solomons 09/08/2024, 10:42 AM        [1]  Social History Tobacco Use   Smoking status: Former    Types: Cigarettes    Passive exposure: Never   Smokeless tobacco: Never   Tobacco comments:    quit 15 yrs ago  Vaping Use   Vaping status: Never Used   Substance Use Topics   Alcohol use: Not Currently   Drug use: Yes    Types: Marijuana    Comment: daily use   [2]  Allergies Allergen Reactions   Percocet [Oxycodone -Acetaminophen ] Itching and Nausea And Vomiting   Theraflu Max-D Cold & Flu [Pseudoephedrine-Dm-Gg-Apap] Nausea And Vomiting

## 2024-09-08 NOTE — Progress Notes (Signed)
 Pt is in office for f/u results and plan.

## 2024-09-13 ENCOUNTER — Ambulatory Visit: Admitting: Obstetrics
